# Patient Record
Sex: Female | Born: 1950 | Race: White | Hispanic: No | Marital: Married | Smoking: Former smoker
Health system: Southern US, Community
[De-identification: ages and names within clinical notes are randomized; demographics above are authoritative.]

## PROBLEM LIST (undated history)

## (undated) DIAGNOSIS — M1990 Unspecified osteoarthritis, unspecified site: Secondary | ICD-10-CM

## (undated) DIAGNOSIS — C801 Malignant (primary) neoplasm, unspecified: Secondary | ICD-10-CM

## (undated) DIAGNOSIS — M797 Fibromyalgia: Secondary | ICD-10-CM

## (undated) DIAGNOSIS — N979 Female infertility, unspecified: Secondary | ICD-10-CM

## (undated) DIAGNOSIS — R32 Unspecified urinary incontinence: Secondary | ICD-10-CM

## (undated) HISTORY — PX: TONSILLECTOMY: SUR1361

## (undated) HISTORY — DX: Unspecified osteoarthritis, unspecified site: M19.90

## (undated) HISTORY — DX: Female infertility, unspecified: N97.9

## (undated) HISTORY — DX: Fibromyalgia: M79.7

## (undated) HISTORY — DX: Malignant (primary) neoplasm, unspecified: C80.1

## (undated) HISTORY — DX: Unspecified urinary incontinence: R32

## (undated) HISTORY — PX: ANKLE SURGERY: SHX546

## (undated) HISTORY — PX: CHOLECYSTECTOMY: SHX55

---

## 1983-08-07 HISTORY — PX: PELVIC LAPAROSCOPY: SHX162

## 1984-08-06 HISTORY — PX: LAPAROTOMY: SHX154

## 1985-08-06 HISTORY — PX: OTHER SURGICAL HISTORY: SHX169

## 1989-08-06 DIAGNOSIS — M797 Fibromyalgia: Secondary | ICD-10-CM

## 1989-08-06 HISTORY — DX: Fibromyalgia: M79.7

## 1997-11-10 ENCOUNTER — Other Ambulatory Visit: Admission: RE | Admit: 1997-11-10 | Discharge: 1997-11-10 | Payer: Self-pay | Admitting: Obstetrics and Gynecology

## 1998-10-03 ENCOUNTER — Ambulatory Visit (HOSPITAL_COMMUNITY): Admission: RE | Admit: 1998-10-03 | Discharge: 1998-10-03 | Payer: Self-pay | Admitting: Gastroenterology

## 1998-12-26 ENCOUNTER — Other Ambulatory Visit: Admission: RE | Admit: 1998-12-26 | Discharge: 1998-12-26 | Payer: Self-pay | Admitting: Obstetrics and Gynecology

## 2000-04-02 ENCOUNTER — Other Ambulatory Visit: Admission: RE | Admit: 2000-04-02 | Discharge: 2000-04-02 | Payer: Self-pay | Admitting: Obstetrics and Gynecology

## 2000-04-17 ENCOUNTER — Encounter: Admission: RE | Admit: 2000-04-17 | Discharge: 2000-07-16 | Payer: Self-pay | Admitting: Gynecology

## 2000-12-30 ENCOUNTER — Encounter: Payer: Self-pay | Admitting: Obstetrics and Gynecology

## 2000-12-30 ENCOUNTER — Encounter: Admission: RE | Admit: 2000-12-30 | Discharge: 2000-12-30 | Payer: Self-pay | Admitting: Obstetrics and Gynecology

## 2001-04-23 ENCOUNTER — Other Ambulatory Visit: Admission: RE | Admit: 2001-04-23 | Discharge: 2001-04-23 | Payer: Self-pay | Admitting: Obstetrics and Gynecology

## 2002-01-29 ENCOUNTER — Encounter: Payer: Self-pay | Admitting: Obstetrics and Gynecology

## 2002-01-29 ENCOUNTER — Encounter: Admission: RE | Admit: 2002-01-29 | Discharge: 2002-01-29 | Payer: Self-pay | Admitting: Obstetrics and Gynecology

## 2002-09-29 ENCOUNTER — Other Ambulatory Visit: Admission: RE | Admit: 2002-09-29 | Discharge: 2002-09-29 | Payer: Self-pay | Admitting: Obstetrics and Gynecology

## 2003-03-24 ENCOUNTER — Encounter: Admission: RE | Admit: 2003-03-24 | Discharge: 2003-03-24 | Payer: Self-pay | Admitting: Obstetrics and Gynecology

## 2003-03-24 ENCOUNTER — Encounter: Payer: Self-pay | Admitting: Obstetrics and Gynecology

## 2003-12-28 ENCOUNTER — Ambulatory Visit (HOSPITAL_COMMUNITY): Admission: RE | Admit: 2003-12-28 | Discharge: 2003-12-28 | Payer: Self-pay | Admitting: Gastroenterology

## 2003-12-28 ENCOUNTER — Encounter (INDEPENDENT_AMBULATORY_CARE_PROVIDER_SITE_OTHER): Payer: Self-pay | Admitting: Specialist

## 2004-04-04 ENCOUNTER — Other Ambulatory Visit: Admission: RE | Admit: 2004-04-04 | Discharge: 2004-04-04 | Payer: Self-pay | Admitting: Obstetrics and Gynecology

## 2004-05-02 ENCOUNTER — Encounter: Admission: RE | Admit: 2004-05-02 | Discharge: 2004-05-02 | Payer: Self-pay | Admitting: Obstetrics and Gynecology

## 2005-07-09 ENCOUNTER — Encounter: Admission: RE | Admit: 2005-07-09 | Discharge: 2005-07-09 | Payer: Self-pay | Admitting: Obstetrics and Gynecology

## 2005-10-11 ENCOUNTER — Other Ambulatory Visit: Admission: RE | Admit: 2005-10-11 | Discharge: 2005-10-11 | Payer: Self-pay | Admitting: Obstetrics and Gynecology

## 2006-08-19 ENCOUNTER — Encounter: Admission: RE | Admit: 2006-08-19 | Discharge: 2006-08-19 | Payer: Self-pay | Admitting: Obstetrics and Gynecology

## 2006-11-20 ENCOUNTER — Other Ambulatory Visit: Admission: RE | Admit: 2006-11-20 | Discharge: 2006-11-20 | Payer: Self-pay | Admitting: Obstetrics and Gynecology

## 2007-11-11 ENCOUNTER — Encounter: Admission: RE | Admit: 2007-11-11 | Discharge: 2007-11-11 | Payer: Self-pay | Admitting: Obstetrics and Gynecology

## 2008-11-23 ENCOUNTER — Encounter: Admission: RE | Admit: 2008-11-23 | Discharge: 2008-11-23 | Payer: Self-pay | Admitting: Obstetrics and Gynecology

## 2009-01-18 ENCOUNTER — Other Ambulatory Visit: Admission: RE | Admit: 2009-01-18 | Discharge: 2009-01-18 | Payer: Self-pay | Admitting: Obstetrics and Gynecology

## 2009-01-18 ENCOUNTER — Encounter: Payer: Self-pay | Admitting: Obstetrics and Gynecology

## 2009-01-18 ENCOUNTER — Ambulatory Visit: Payer: Self-pay | Admitting: Obstetrics and Gynecology

## 2009-01-31 ENCOUNTER — Ambulatory Visit: Payer: Self-pay | Admitting: Obstetrics and Gynecology

## 2009-06-03 ENCOUNTER — Encounter: Admission: RE | Admit: 2009-06-03 | Discharge: 2009-06-03 | Payer: Self-pay | Admitting: Otolaryngology

## 2009-08-06 HISTORY — PX: OTHER SURGICAL HISTORY: SHX169

## 2009-08-06 HISTORY — PX: MOUTH SURGERY: SHX715

## 2009-08-23 ENCOUNTER — Ambulatory Visit (HOSPITAL_BASED_OUTPATIENT_CLINIC_OR_DEPARTMENT_OTHER): Admission: RE | Admit: 2009-08-23 | Discharge: 2009-08-23 | Payer: Self-pay | Admitting: Otolaryngology

## 2010-01-26 ENCOUNTER — Other Ambulatory Visit: Admission: RE | Admit: 2010-01-26 | Discharge: 2010-01-26 | Payer: Self-pay | Admitting: Obstetrics and Gynecology

## 2010-01-26 ENCOUNTER — Ambulatory Visit: Payer: Self-pay | Admitting: Obstetrics and Gynecology

## 2010-01-30 ENCOUNTER — Encounter: Admission: RE | Admit: 2010-01-30 | Discharge: 2010-01-30 | Payer: Self-pay | Admitting: Obstetrics and Gynecology

## 2010-10-22 LAB — POCT HEMOGLOBIN-HEMACUE: Hemoglobin: 15.4 g/dL — ABNORMAL HIGH (ref 12.0–15.0)

## 2010-12-22 NOTE — Op Note (Signed)
NAME:  Mary Marquez, Mary Marquez                      ACCOUNT NO.:  [id number]   MEDICAL RECORD NO.:  [id number]                   PATIENT TYPE:  AMB   LOCATION:  ENDO                                 FACILITY:  [facility]   PHYSICIAN:  Petra Kuba, M.D.                 DATE OF BIRTH:  September 11, 1950   DATE OF PROCEDURE:  12/28/2003  DATE OF DISCHARGE:                                 OPERATIVE REPORT   PROCEDURE:  Colonoscopy.   INDICATIONS FOR PROCEDURE:  A patient with a history of colon polyps due for  repeat screening.   Consent was signed after risks, benefits, methods, and options were  thoroughly discussed in the office multiple times in the past.   MEDICINES USED:  Demerol 70, Versed 7.   DESCRIPTION OF PROCEDURE:  Rectal inspection was pertinent for external  hemorrhoids, small.  She also had a small fissure. Digital exam was  negative. The video pediatric adjustable colonoscope was inserted, fairly  easily advanced around the colon to the cecum. This did require left lower  quadrant pressure but no position changes. No obvious abnormality was seen  on insertion. The cecum was identified by the appendiceal orifice and the  ileocecal valve.  The scope was slowly withdrawn. The prep was adequate.  There was some liquid stool that required washing and suctioning. On slow  withdrawal through the colon in the mid ascending a small polyp along the  fold was seen, snared, electrocautery applied and the bulb was suctioned  through the scope and collected in the trap. The scope was further  withdrawn. The transverse was normal. In the more proximal descending, a  tiny polyp was seen and was hot biopsied x1 and put in a separate container.  The scope was further withdrawn. No additional findings were seen as we  withdrew back to the rectum. Anal rectal pullthrough and retroflexion  confirmed some small hemorrhoids. The scope was reinserted a short ways up  the left side of the colon, air was  suctioned, scope removed. The patient  tolerated the procedure well. There was no obvious or immediate  complications.   ENDOSCOPIC DIAGNOSIS:  1. Internal and external hemorrhoids with a small fissure.  2. One tiny descending polyp hot biopsied.  3. Small ascending polyp snared.  4. Otherwise within normal limits to the cecum.   PLAN:  Await pathology to determine future colonic screening.  Happy to see  back p.r.n. otherwise return care to Dr. Clarene Duke for the customary health  care maintenance to include yearly rectals and guaiacs.                                               Petra Kuba, M.D.    MEM/MEDQ  D:  12/28/2003  T:  12/28/2003  Job:  [id number]   cc:   Caryn Bee L. Little, M.D.  [address]  [place]  [place] [address]  Fax: [phone]

## 2010-12-28 ENCOUNTER — Other Ambulatory Visit: Payer: Self-pay | Admitting: Obstetrics and Gynecology

## 2010-12-28 DIAGNOSIS — Z1231 Encounter for screening mammogram for malignant neoplasm of breast: Secondary | ICD-10-CM

## 2011-02-09 ENCOUNTER — Ambulatory Visit
Admission: RE | Admit: 2011-02-09 | Discharge: 2011-02-09 | Disposition: A | Discharge status: Home / Self Care | Payer: BC Managed Care – PPO | Source: Ambulatory Visit | Attending: Obstetrics and Gynecology | Admitting: Obstetrics and Gynecology

## 2011-02-09 DIAGNOSIS — Z1231 Encounter for screening mammogram for malignant neoplasm of breast: Secondary | ICD-10-CM

## 2011-04-24 DIAGNOSIS — N809 Endometriosis, unspecified: Secondary | ICD-10-CM | POA: Insufficient documentation

## 2011-05-03 ENCOUNTER — Other Ambulatory Visit: Payer: Self-pay | Admitting: Obstetrics and Gynecology

## 2011-05-03 ENCOUNTER — Ambulatory Visit: Admission: RE | Admit: 2011-05-03 | Payer: BC Managed Care – PPO | Source: Ambulatory Visit

## 2011-05-03 DIAGNOSIS — Z13820 Encounter for screening for osteoporosis: Secondary | ICD-10-CM

## 2011-05-03 DIAGNOSIS — M899 Disorder of bone, unspecified: Secondary | ICD-10-CM

## 2011-05-03 DIAGNOSIS — M949 Disorder of cartilage, unspecified: Secondary | ICD-10-CM

## 2011-05-30 ENCOUNTER — Encounter: Payer: Self-pay | Admitting: Obstetrics and Gynecology

## 2011-05-30 ENCOUNTER — Ambulatory Visit (INDEPENDENT_AMBULATORY_CARE_PROVIDER_SITE_OTHER): Payer: BC Managed Care – PPO | Admitting: Obstetrics and Gynecology

## 2011-05-30 ENCOUNTER — Other Ambulatory Visit (HOSPITAL_COMMUNITY)
Admission: RE | Admit: 2011-05-30 | Discharge: 2011-05-30 | Disposition: A | Discharge status: Home / Self Care | Payer: BC Managed Care – PPO | Source: Ambulatory Visit | Attending: Obstetrics and Gynecology | Admitting: Obstetrics and Gynecology

## 2011-05-30 VITALS — BP 116/74 | Ht 62.0 in

## 2011-05-30 DIAGNOSIS — M8580 Other specified disorders of bone density and structure, unspecified site: Secondary | ICD-10-CM

## 2011-05-30 DIAGNOSIS — M899 Disorder of bone, unspecified: Secondary | ICD-10-CM

## 2011-05-30 DIAGNOSIS — M1990 Unspecified osteoarthritis, unspecified site: Secondary | ICD-10-CM | POA: Insufficient documentation

## 2011-05-30 DIAGNOSIS — Z01419 Encounter for gynecological examination (general) (routine) without abnormal findings: Secondary | ICD-10-CM

## 2011-05-30 NOTE — Progress Notes (Signed)
Patient came to see me today for her annual GYN exam. She is having minimal menopausal symptoms. She is okay without estrogen. She is up-to-date on mammograms. She does her lab work to her PCP. She has been treated by him for arthritis. She is dealing with two parents with dementia which is very stressful. She is having no vaginal bleeding or pelvic pain. She does have a bone density in our office. She is osteopenia without an elevated FRAX risk. Her bone density was stable from the last one.  ROS: 12 point review done. History of basal cell skin cancer watched by Dr. Nicholas Lose. Arthritis. Otherwise all negative.  Physical examination: Kennon Portela present HEENT within normal limits. Neck: Thyroid not large. No masses. Supraclavicular nodes: not enlarged. Breasts: Examined in both sitting midline position. No skin changes and no masses. Abdomen: Soft no guarding rebound or masses or hernia. Pelvic: External: Within normal limits. BUS: Within normal limits. Vaginal:within normal limits. Good estrogen effect. No evidence of cystocele rectocele or enterocele. Cervix: clean. Uterus: Normal size and shape. Adnexa: No masses. Rectovaginal exam: Confirmatory and negative. Extremities: Within normal limits.  Assessment: Osteopenia  Plan: Continue yearly mammograms. Bone density in 2014.

## 2011-12-06 ENCOUNTER — Other Ambulatory Visit: Payer: Self-pay | Admitting: Family Medicine

## 2011-12-06 DIAGNOSIS — J329 Chronic sinusitis, unspecified: Secondary | ICD-10-CM

## 2011-12-13 ENCOUNTER — Ambulatory Visit
Admission: RE | Admit: 2011-12-13 | Discharge: 2011-12-13 | Disposition: A | Discharge status: Home / Self Care | Payer: BC Managed Care – PPO | Source: Ambulatory Visit | Attending: Family Medicine | Admitting: Family Medicine

## 2011-12-13 DIAGNOSIS — J329 Chronic sinusitis, unspecified: Secondary | ICD-10-CM

## 2013-08-12 ENCOUNTER — Encounter: Payer: Self-pay | Admitting: Obstetrics and Gynecology

## 2013-08-12 ENCOUNTER — Ambulatory Visit (INDEPENDENT_AMBULATORY_CARE_PROVIDER_SITE_OTHER): Payer: BC Managed Care – PPO | Admitting: Obstetrics and Gynecology

## 2013-08-12 VITALS — BP 127/76 | HR 74 | Resp 16 | Ht 62.5 in | Wt 195.0 lb

## 2013-08-12 DIAGNOSIS — Z0000 Encounter for general adult medical examination without abnormal findings: Secondary | ICD-10-CM

## 2013-08-12 DIAGNOSIS — M899 Disorder of bone, unspecified: Secondary | ICD-10-CM

## 2013-08-12 DIAGNOSIS — M8580 Other specified disorders of bone density and structure, unspecified site: Secondary | ICD-10-CM

## 2013-08-12 DIAGNOSIS — Z01419 Encounter for gynecological examination (general) (routine) without abnormal findings: Secondary | ICD-10-CM

## 2013-08-12 DIAGNOSIS — M949 Disorder of cartilage, unspecified: Secondary | ICD-10-CM

## 2013-08-12 LAB — POCT URINALYSIS DIPSTICK
BILIRUBIN UA: NEGATIVE
Glucose, UA: NEGATIVE
Ketones, UA: NEGATIVE
LEUKOCYTES UA: NEGATIVE
Nitrite, UA: NEGATIVE
PH UA: 5
Protein, UA: NEGATIVE
RBC UA: NEGATIVE
Urobilinogen, UA: NEGATIVE

## 2013-08-12 LAB — HEMOGLOBIN, FINGERSTICK: Hemoglobin, fingerstick: 14.2 g/dL (ref 12.0–16.0)

## 2013-08-12 NOTE — Progress Notes (Signed)
GYNECOLOGY VISIT  PCP: Dr. Hulan Fess   Referring provider:   HPI: 63 y.o.   Married  Caucasian  female   G2P2002 with Patient's last menstrual period was 08/06/1998.   here for   Annual Exam Patient had underarm bilateral pain when was using an underwire bra. Changed the bra and now feeling a lot better. Has gained 40 pounds due to stress of her ailing parents.  Both are now deceased - mother on Easter, father on July 4th.   Some incontinence with coughing.  Seems better now.   Not sexually active due to husband's health issues.   Would like to do a fasting cholesterol and blood work in 2 - 3 months after does weight loss through diet and exercise.   Magnesium helps with muscle cramping.    Hgb:  14.2 Urine:  neg  GYNECOLOGIC HISTORY: Patient's last menstrual period was 08/06/1998. Sexually active:  no Partner preference: female Contraception:  Menopausal  Menopausal hormone therapy: no DES exposure:   no Blood transfusions:   no Sexually transmitted diseases:   no GYN Procedures:  Fallopian  tube removed - RSO - endometriosis Mammogram:  2011    normal           Pap:   2011 neg History of abnormal pap smear:  ?   OB History   Grav Para Term Preterm Abortions TAB SAB Ect Mult Living   2 2 2       2        LIFESTYLE: Exercise:  Not now             Tobacco: quit 40+ years ago Alcohol: 2-3 glasses of wine a week Drug use:  no  OTHER HEALTH MAINTENANCE: Tetanus/TDap: less than 10years Gardisil: no Influenza: yes  Zostavax:yes  Bone density: 2012 - osteopenia.  No T score recorded from [facility] Colonoscopy: 2010 polyps, repeat in 5 years  Cholesterol check: 2011  Family History  Problem Relation Age of Onset  . Diabetes Father   . Alzheimer's disease Father   . Cancer Father     Bone/Bladder Cancer  . Prostate cancer Father   . Cancer Paternal Grandmother     OVARIAN? UTERINE?  Marland Kitchen Cancer Cousin     PATERNAL/COLON CANCER  . Stroke Maternal  Grandmother   . Alzheimer's disease Mother   . Cancer Mother     colon cancer    Patient Active Problem List   Diagnosis Date Noted  . Arthritis   . Endometriosis    Past Medical History  Diagnosis Date  . Endometriosis   . Arthritis   . Cancer     Basal cell  . Fibromyalgia 1991  . Infertility, female   . Urinary incontinence     Past Surgical History  Procedure Laterality Date  . Tonsillectomy    . Exploratory laparotomy, rso and exc of endometriosis  1986  . Pelvic laparoscopy  1985    DIAG LAP W/TUBAL LAVAGE  . Cholecystectomy    . Mouth surgery  2011  . Oophorectomy  1986    RSO AT TIME OF LAPAROTOMY  . Ankle surgery    . Removal fallopian tube  1987  . Polyp removed Right 2011    Near side side of Nose    ALLERGIES: Other  Current Outpatient Prescriptions  Medication Sig Dispense Refill  . aspirin 81 MG tablet Take 81 mg by mouth daily.        Marland Kitchen BIOTIN PO Take by mouth.        Marland Kitchen  cholecalciferol (VITAMIN D) 1000 UNITS tablet Take 1,000 Units by mouth daily.        Marland Kitchen CINNAMON PO Take by mouth daily.      . Coenzyme Q10 (CO Q 10 PO) Take by mouth.        . CRANBERRY PO Take by mouth daily.      Marland Kitchen EPINEPHrine (EPI-PEN) 0.3 mg/0.3 mL DEVI Inject 0.3 mg into the muscle once.        Marland Kitchen FIBER SELECT GUMMIES PO Take by mouth daily.      Marland Kitchen MAGNESIUM CITRATE PO Take 250 mg by mouth.      . Multiple Vitamin (MULTIVITAMIN) capsule Take 1 capsule by mouth daily.        . Omega-3 Fatty Acids (FISH OIL PO) Take by mouth.        . Probiotic Product (PROBIOTIC FORMULA PO) Take by mouth.        . vitamin C (ASCORBIC ACID) 250 MG tablet Take 250 mg by mouth daily.      . Flaxseed, Linseed, (FLAX SEEDS PO) Take by mouth.         No current facility-administered medications for this visit.     ROS:  Pertinent items are noted in HPI.  SOCIAL HISTORY:  Retired  PHYSICAL EXAMINATION:    BP 127/76  Pulse 74  Resp 16  Ht 5' 2.5" (1.588 m)  Wt 195 lb (88.451 kg)  BMI  35.08 kg/m2  LMP 08/06/1998   Wt Readings from Last 3 Encounters:  08/12/13 195 lb (88.451 kg)     Ht Readings from Last 3 Encounters:  08/12/13 5' 2.5" (1.588 m)  05/30/11 5\' 2"  (1.575 m)    General appearance: alert, cooperative and appears stated age Head: Normocephalic, without obvious abnormality, atraumatic Neck: no adenopathy, supple, symmetrical, trachea midline and thyroid not enlarged, symmetric, no tenderness/mass/nodules Lungs: clear to auscultation bilaterally Breasts: Inspection negative, No nipple retraction or dimpling, No nipple discharge or bleeding, No axillary or supraclavicular adenopathy, Normal to palpation without dominant masses Heart: regular rate and rhythm Abdomen: soft, non-tender; no masses,  no organomegaly Extremities: extremities normal, atraumatic, no cyanosis or edema Skin: Skin color, texture, turgor normal. No rashes or lesions Lymph nodes: Cervical, supraclavicular, and axillary nodes normal. No abnormal inguinal nodes palpated Neurologic: Grossly normal  Pelvic: External genitalia:  no lesions              Urethra:  normal appearing urethra with no masses, tenderness or lesions              Bartholins and Skenes: normal                 Vagina: normal appearing vagina with normal color and discharge, no lesions              Cervix: normal appearance              Pap and high risk HPV testing done: yes.            Bimanual Exam:  Uterus:  uterus is normal size, shape, consistency and nontender                                      Adnexa: normal adnexa in size, nontender and no masses  Rectovaginal: Confirms                                      Anus:  normal sphincter tone, no lesions  ASSESSMENT  Normal gynecologic exam. Osteopenia.  PLAN  M[facility] at [facility].  Patient will call. Pap smear and high risk HPV testing performed. Bone density at [facility].   Patient will call. Counseled on   Calcium, vit D, weight bearing exercise, weight loss. Will return for labs in 3 months.  Will place an order for future lipid profile, CBC, CMP, TSH. Return annually or prn.   An After Visit Summary was printed and given to the patient.

## 2013-08-12 NOTE — Patient Instructions (Signed)

## 2013-08-14 ENCOUNTER — Other Ambulatory Visit: Payer: Self-pay | Admitting: Obstetrics and Gynecology

## 2013-08-14 DIAGNOSIS — Z1231 Encounter for screening mammogram for malignant neoplasm of breast: Secondary | ICD-10-CM

## 2013-08-17 LAB — IPS PAP TEST WITH HPV

## 2013-09-15 ENCOUNTER — Ambulatory Visit: Payer: BC Managed Care – PPO

## 2013-09-15 ENCOUNTER — Ambulatory Visit
Admission: RE | Admit: 2013-09-15 | Discharge: 2013-09-15 | Disposition: A | Discharge status: Home / Self Care | Payer: BC Managed Care – PPO | Source: Ambulatory Visit | Attending: Obstetrics and Gynecology | Admitting: Obstetrics and Gynecology

## 2013-09-15 DIAGNOSIS — M8580 Other specified disorders of bone density and structure, unspecified site: Secondary | ICD-10-CM

## 2013-09-15 DIAGNOSIS — Z1231 Encounter for screening mammogram for malignant neoplasm of breast: Secondary | ICD-10-CM

## 2014-06-07 ENCOUNTER — Encounter: Payer: Self-pay | Admitting: Obstetrics and Gynecology

## 2014-08-13 ENCOUNTER — Ambulatory Visit: Payer: BC Managed Care – PPO | Admitting: Obstetrics and Gynecology

## 2014-12-28 ENCOUNTER — Other Ambulatory Visit: Payer: Self-pay

## 2014-12-28 DIAGNOSIS — Z1231 Encounter for screening mammogram for malignant neoplasm of breast: Secondary | ICD-10-CM

## 2014-12-30 ENCOUNTER — Ambulatory Visit
Admission: RE | Admit: 2014-12-30 | Discharge: 2014-12-30 | Disposition: A | Discharge status: Home / Self Care | Payer: Medicare Other | Source: Ambulatory Visit

## 2014-12-30 DIAGNOSIS — Z1231 Encounter for screening mammogram for malignant neoplasm of breast: Secondary | ICD-10-CM

## 2015-01-14 ENCOUNTER — Telehealth: Payer: Self-pay | Admitting: Obstetrics and Gynecology

## 2015-01-14 NOTE — Telephone Encounter (Signed)
Patient is scheduled for her aex 03/10/15 and is requesting fasting labs at that appointment. Last seen 08/12/13.

## 2015-01-17 NOTE — Telephone Encounter (Signed)
Left message to call [name] at [phone]. 

## 2015-01-21 NOTE — Telephone Encounter (Signed)
Left message to call Baldo Hufnagle at [phone]. 

## 2015-01-26 NOTE — Telephone Encounter (Signed)
Spoke with patient. Patient has aex appointment scheduled for 03/10/2015 with Dr.Jertson. Patient would like to have fasting labs at this appointment. Advised may have fasting labs with aex appointment. Advised will need to notify CMA and MD when she comes in for appointment to ensure all orders are placed. Patient is agreeable.  Routing to provider for final review. Patient agreeable to disposition. Will close encounter.

## 2015-03-10 ENCOUNTER — Telehealth: Payer: Self-pay | Admitting: Obstetrics and Gynecology

## 2015-03-10 ENCOUNTER — Ambulatory Visit: Payer: Self-pay | Admitting: Obstetrics and Gynecology

## 2015-03-10 NOTE — Telephone Encounter (Signed)
Patient cancelled appointment for today.  Separate staff message sent.

## 2015-03-15 ENCOUNTER — Other Ambulatory Visit: Payer: Self-pay | Admitting: Gastroenterology

## 2015-03-17 NOTE — Telephone Encounter (Signed)
Left message on voicemail to call and reschedule aex. [employer] auth# [id number] 8/4-10/4/16.

## 2015-03-17 NOTE — Telephone Encounter (Signed)
Patient returned call and scheduled aex.

## 2015-04-14 ENCOUNTER — Ambulatory Visit (INDEPENDENT_AMBULATORY_CARE_PROVIDER_SITE_OTHER): Payer: Medicare Other | Admitting: Obstetrics and Gynecology

## 2015-04-14 ENCOUNTER — Encounter: Payer: Self-pay | Admitting: Obstetrics and Gynecology

## 2015-04-14 VITALS — BP 124/80 | HR 78 | Resp 14 | Ht 62.0 in | Wt 180.0 lb

## 2015-04-14 DIAGNOSIS — Z0000 Encounter for general adult medical examination without abnormal findings: Secondary | ICD-10-CM

## 2015-04-14 DIAGNOSIS — Z01419 Encounter for gynecological examination (general) (routine) without abnormal findings: Secondary | ICD-10-CM | POA: Diagnosis not present

## 2015-04-14 DIAGNOSIS — R32 Unspecified urinary incontinence: Secondary | ICD-10-CM | POA: Insufficient documentation

## 2015-04-14 DIAGNOSIS — M797 Fibromyalgia: Secondary | ICD-10-CM | POA: Insufficient documentation

## 2015-04-14 LAB — COMPREHENSIVE METABOLIC PANEL
ALBUMIN: 4.5 g/dL (ref 3.6–5.1)
ALT: 27 U/L (ref 6–29)
AST: 27 U/L (ref 10–35)
Alkaline Phosphatase: 77 U/L (ref 33–130)
BUN: 13 mg/dL (ref 7–25)
CALCIUM: 9.3 mg/dL (ref 8.6–10.4)
CHLORIDE: 103 mmol/L (ref 98–110)
CO2: 29 mmol/L (ref 20–31)
Creat: 0.73 mg/dL (ref 0.50–0.99)
Glucose, Bld: 85 mg/dL (ref 65–99)
POTASSIUM: 4.8 mmol/L (ref 3.5–5.3)
Sodium: 140 mmol/L (ref 135–146)
TOTAL PROTEIN: 7.2 g/dL (ref 6.1–8.1)
Total Bilirubin: 0.7 mg/dL (ref 0.2–1.2)

## 2015-04-14 LAB — CBC
HEMATOCRIT: 43.6 % (ref 36.0–46.0)
HEMOGLOBIN: 14.6 g/dL (ref 12.0–15.0)
MCH: 29.4 pg (ref 26.0–34.0)
MCHC: 33.5 g/dL (ref 30.0–36.0)
MCV: 87.9 fL (ref 78.0–100.0)
MPV: 8.8 fL (ref 8.6–12.4)
Platelets: 283 10*3/uL (ref 150–400)
RBC: 4.96 MIL/uL (ref 3.87–5.11)
RDW: 13.5 % (ref 11.5–15.5)
WBC: 6.3 10*3/uL (ref 4.0–10.5)

## 2015-04-14 LAB — LIPID PANEL
CHOLESTEROL: 235 mg/dL — AB (ref 125–200)
HDL: 63 mg/dL (ref >=46)
LDL CALC: 129 mg/dL (ref <130)
Total CHOL/HDL Ratio: 3.7 Ratio (ref <=5.0)
Triglycerides: 216 mg/dL — ABNORMAL HIGH (ref <150)
VLDL: 43 mg/dL — ABNORMAL HIGH (ref <30)

## 2015-04-14 LAB — POCT URINALYSIS DIPSTICK
Bilirubin, UA: NEGATIVE
Blood, UA: NEGATIVE
GLUCOSE UA: NEGATIVE
Ketones, UA: NEGATIVE
Leukocytes, UA: NEGATIVE
Nitrite, UA: NEGATIVE
Protein, UA: NEGATIVE
UROBILINOGEN UA: NEGATIVE
pH, UA: 6.5

## 2015-04-14 NOTE — Progress Notes (Addendum)
Patient ID: Mary Marquez, female   DOB: 1951/03/15, 64 y.o.   MRN: [id number] 64 y.o. [id number] MarriedCaucasianF here for annual exam.  Not bleeding. Not sexually active secondary to husbands medical issues. She has had weight gain along with emotional stress of her parents death. Has always gone up and down. She has lost some weight.     Patient's last menstrual period was 08/06/1998.          Sexually active: No.  The current method of family planning is post menopausal status.    Exercising: Yes.    yard work Smoker:  no  Health Maintenance: Pap: 08-12-13 WNL NEG HPV  History of abnormal Pap:  No sure, no h/o dysplasia. MMG:  12-30-14 WNL  Colonoscopy:  03/2015 1 polyp - repeat in 5 yrs  BMD:   09-15-13 Osteopenia of hip  TDaP:  Unsure, she will check with her primary Gardasil: N/A   reports that she quit smoking about 41 years ago. She has never used smokeless tobacco. She reports that she drinks about 1.5 oz of alcohol per week. She reports that she does not use illicit drugs.  Past Medical History  Diagnosis Date  . Endometriosis   . Arthritis   . Cancer     Basal cell  . Fibromyalgia 1991  . Infertility, female   . Urinary incontinence     Past Surgical History  Procedure Laterality Date  . Tonsillectomy    . Laparotomy  1986    salpingectomy  . Pelvic laparoscopy  1985    DIAG LAP W/TUBAL LAVAGE  . Cholecystectomy    . Mouth surgery  2011  . Ankle surgery    . Removal fallopian tube  1987  . Polyp removed Right 2011    Near side side of Nose  She states both tubes were removed, has her ovaries  Current Outpatient Prescriptions  Medication Sig Dispense Refill  . BIOTIN PO Take by mouth.      . calcium citrate (CALCITRATE - DOSED IN MG ELEMENTAL CALCIUM) 950 MG tablet Take 200 mg of elemental calcium by mouth daily.    . cholecalciferol (VITAMIN D) 1000 UNITS tablet Take 1,000 Units by mouth daily.      . Coenzyme Q10 (CO Q 10 PO) Take by mouth.      . FIBER  SELECT GUMMIES PO Take by mouth daily.    . Flaxseed, Linseed, (FLAX SEEDS PO) Take by mouth.      Marland Kitchen MAGNESIUM CITRATE PO Take 250 mg by mouth.    . Multiple Vitamin (MULTIVITAMIN) capsule Take 1 capsule by mouth daily.      . Omega-3 Fatty Acids (FISH OIL PO) Take by mouth.      . Probiotic Product (PROBIOTIC FORMULA PO) Take by mouth.      . vitamin C (ASCORBIC ACID) 250 MG tablet Take 250 mg by mouth daily.    Marland Kitchen EPINEPHrine (EPI-PEN) 0.3 mg/0.3 mL DEVI Inject 0.3 mg into the muscle once.       No current facility-administered medications for this visit.    Family History  Problem Relation Age of Onset  . Diabetes Father   . Alzheimer's disease Father   . Cancer Father     Bone/Bladder Cancer  . Prostate cancer Father   . Cancer Paternal Grandmother     OVARIAN? UTERINE?  Marland Kitchen Cancer Cousin     PATERNAL/COLON CANCER  . Stroke Maternal Grandmother   . Alzheimer's disease Mother   .  Cancer Mother     colon cancer  Parents died at 78 (mom) and 69 (dad)  Review of Systems  Constitutional: Negative.   HENT: Positive for rhinorrhea and sinus pressure.   Eyes: Negative.   Respiratory: Negative.   Cardiovascular: Negative.   Gastrointestinal: Negative.   Endocrine: Negative.   Genitourinary: Negative.   Musculoskeletal: Negative.   Skin: Negative.   Allergic/Immunologic: Negative.   Neurological: Negative.   Psychiatric/Behavioral: Negative.     Exam:   BP 124/80 mmHg  Pulse 78  Resp 14  Ht 5\' 2"  (1.575 m)  Wt 180 lb (81.647 kg)  BMI 32.91 kg/m2  LMP 08/06/1998  Weight change: @WEIGHTCHANGE @ Height:   Height: 5\' 2"  (157.5 cm)  Ht Readings from Last 3 Encounters:  04/14/15 5\' 2"  (1.575 m)  08/12/13 5' 2.5" (1.588 m)  05/30/11 5\' 2"  (1.575 m)    General appearance: alert, cooperative and appears stated age Head: Normocephalic, without obvious abnormality, atraumatic Neck: no adenopathy, supple, symmetrical, trachea midline and thyroid normal to inspection and  palpation Lungs: clear to auscultation bilaterally Breasts: normal appearance, no masses or tenderness Heart: regular rate and rhythm Abdomen: soft, non-tender; bowel sounds normal; no masses,  no organomegaly Extremities: extremities normal, atraumatic, no cyanosis or edema Skin: Skin color, texture, turgor normal. No rashes or lesions Lymph nodes: Cervical, supraclavicular, and axillary nodes normal. No abnormal inguinal nodes palpated Neurologic: Grossly normal   Pelvic: External genitalia:  no lesions              Urethra:  normal appearing urethra with no masses, tenderness or lesions              Bartholins and Skenes: normal                 Vagina: normal appearing vagina with normal color and discharge, no lesions              Cervix: no lesions               Bimanual Exam:  Uterus:  normal size, contour, position, consistency, mobility, non-tender and anteverted              Adnexa: no mass, fullness, tenderness               Rectovaginal: Confirms               Anus:  normal sphincter tone, no lesions  Chaperone was present for exam.  A:  Well Woman with normal exam  Very mild osteopenia, T score of -1.1  P:   No pap this year  Screening labs  Next DEXA in 4 years  Continue calcium, vit D, exercise  Discussed BSE  Yearly mammogram

## 2015-04-14 NOTE — Patient Instructions (Signed)

## 2015-04-15 LAB — VITAMIN D 25 HYDROXY (VIT D DEFICIENCY, FRACTURES): VIT D 25 HYDROXY: 46 ng/mL (ref 30–100)

## 2015-04-18 ENCOUNTER — Telehealth: Payer: Self-pay | Admitting: Obstetrics and Gynecology

## 2015-04-18 NOTE — Telephone Encounter (Signed)
Result note just sent to [facility]

## 2015-04-18 NOTE — Telephone Encounter (Signed)
Routing to [place] for review and advise of results from 04/14/2015.

## 2015-04-18 NOTE — Telephone Encounter (Signed)
Patient is calling for recent lab results, last seen 04/14/15.

## 2015-04-18 NOTE — Telephone Encounter (Signed)
Spoke with patient. Results given as seen below. Patient is agreeable and verbalizes understanding. Patient has an appointment scheduled to see her PCP coming up. Will follow up regarding her cholesterol levels.  Routing to provider for final review. Patient agreeable to disposition. Will close encounter.

## 2015-04-18 NOTE — Telephone Encounter (Signed)
Left message to call [place] at [phone].  Notes Recorded by Salvadore Dom, MD on 04/18/2015 at 3:14 PM Please inform the patient that her total cholesterol and triglycerides were elevated. If she wasn't fasting, she should repeat fasting, other wise she should be on a diet low in saturated fats, exercise regularly and f/u with her primary. The rest of her labs were normal.  Thanks

## 2015-12-20 ENCOUNTER — Other Ambulatory Visit: Payer: Self-pay

## 2015-12-20 DIAGNOSIS — Z1231 Encounter for screening mammogram for malignant neoplasm of breast: Secondary | ICD-10-CM

## 2016-01-04 ENCOUNTER — Ambulatory Visit
Admission: RE | Admit: 2016-01-04 | Discharge: 2016-01-04 | Disposition: A | Discharge status: Home / Self Care | Payer: Medicare Other | Source: Ambulatory Visit

## 2016-01-04 DIAGNOSIS — Z1231 Encounter for screening mammogram for malignant neoplasm of breast: Secondary | ICD-10-CM

## 2016-10-03 ENCOUNTER — Ambulatory Visit (INDEPENDENT_AMBULATORY_CARE_PROVIDER_SITE_OTHER): Payer: Medicare Other | Admitting: Obstetrics and Gynecology

## 2016-10-03 ENCOUNTER — Encounter: Payer: Self-pay | Admitting: Obstetrics and Gynecology

## 2016-10-03 ENCOUNTER — Ambulatory Visit: Payer: Medicare Other | Admitting: Obstetrics and Gynecology

## 2016-10-03 VITALS — BP 110/60 | HR 72 | Resp 16 | Ht 62.0 in

## 2016-10-03 DIAGNOSIS — Z01419 Encounter for gynecological examination (general) (routine) without abnormal findings: Secondary | ICD-10-CM

## 2016-10-03 DIAGNOSIS — M8580 Other specified disorders of bone density and structure, unspecified site: Secondary | ICD-10-CM

## 2016-10-03 DIAGNOSIS — Z124 Encounter for screening for malignant neoplasm of cervix: Secondary | ICD-10-CM

## 2016-10-03 NOTE — Patient Instructions (Signed)

## 2016-10-03 NOTE — Addendum Note (Signed)
Addended by: Dorothy Spark on: 10/03/2016 12:48 PM   Modules accepted: Orders

## 2016-10-03 NOTE — Progress Notes (Signed)
66 y.o. [id number] MarriedCaucasianF here for annual exam.   She c/o pain in her rib cage around to her shoulder. The tissue in her lateral chest wall gets tender and hot. She has stopped wearing a bra with support because it hurts the tissue. Her breast are very large and she is wondering if that is the problem. If she takes tylenol it helps. She has a h/o fibromyalgia. She is going to try and loose weight. She has lost 8 lbs since January, some from GERD, some with stress.  She c/o fatigue. She will have her thyroid checked with her primary.  No vaginal bleeding. She has occasional GSI. It is better than it was.  Not sexually active (husbands medical issues).    Patient's last menstrual period was 08/06/1998.          Sexually active: No.  The current method of family planning is post menopausal status.    Exercising: No.  The patient does not participate in regular exercise at present. Smoker:  Former smoker   Health Maintenance: Pap:  08-12-13 WNL NEG HR HPV History of abnormal Pap:  yes years ago MMG:  01-04-16 WNL  Colonoscopy:  2015 Polyps, due in 2020 BMD:   09-15-13 Osteopenia of hip  TDaP:  Up to date PCP does  Gardasil: N/A   reports that she quit smoking about 43 years ago. She has never used smokeless tobacco. She reports that she does not drink alcohol or use drugs. Her son and daughter-in-law are fostering to adopt twins, one of them had a fractured leg, issues with DES. She and her husband are helping out, lots of stress.  Daughter is married, had a baby boy in August, has a 27 and 103 year old and fosters twin boys.   Past Medical History:  Diagnosis Date  . Arthritis   . Cancer (HCC)    Basal cell  . Endometriosis   . Fibromyalgia 1991  . Infertility, female   . Urinary incontinence     Past Surgical History:  Procedure Laterality Date  . ANKLE SURGERY    . CHOLECYSTECTOMY    . LAPAROTOMY  1986   salpingectomy  . MOUTH SURGERY  2011  . PELVIC LAPAROSCOPY  1985    DIAG LAP W/TUBAL LAVAGE  . POLYP REMOVED Right 2011   Near side side of Nose  . removal fallopian tube  1987  . TONSILLECTOMY      Current Outpatient Prescriptions  Medication Sig Dispense Refill  . cholecalciferol (VITAMIN D) 1000 UNITS tablet Take 1,000 Units by mouth daily.      Marland Kitchen EPINEPHrine (EPI-PEN) 0.3 mg/0.3 mL DEVI Inject 0.3 mg into the muscle once.      . Multiple Vitamin (MULTIVITAMIN) capsule Take 1 capsule by mouth daily.      Marland Kitchen BIOTIN PO Take by mouth.      . calcium citrate (CALCITRATE - DOSED IN MG ELEMENTAL CALCIUM) 950 MG tablet Take 200 mg of elemental calcium by mouth daily.    . Coenzyme Q10 (CO Q 10 PO) Take by mouth.      . FIBER SELECT GUMMIES PO Take by mouth daily.    . Flaxseed, Linseed, (FLAX SEEDS PO) Take by mouth.      Marland Kitchen MAGNESIUM CITRATE PO Take 250 mg by mouth.    . Omega-3 Fatty Acids (FISH OIL PO) Take by mouth.      . Probiotic Product (PROBIOTIC FORMULA PO) Take by mouth.      Marland Kitchen  vitamin C (ASCORBIC ACID) 250 MG tablet Take 250 mg by mouth daily.     No current facility-administered medications for this visit.     Family History  Problem Relation Age of Onset  . Diabetes Father   . Alzheimer's disease Father   . Cancer Father     Bone/Bladder Cancer  . Prostate cancer Father   . Cancer Paternal Grandmother     OVARIAN? UTERINE?  Marland Kitchen Cancer Cousin     PATERNAL/COLON CANCER  . Stroke Maternal Grandmother   . Alzheimer's disease Mother   . Cancer Mother     colon cancer    Review of Systems  Constitutional: Negative.   HENT: Negative.   Eyes: Negative.   Respiratory: Negative.   Cardiovascular: Negative.   Gastrointestinal: Negative.   Endocrine: Negative.   Genitourinary: Negative.   Musculoskeletal: Negative.        Upper back pain  Skin: Negative.   Allergic/Immunologic: Negative.   Neurological: Negative.   Psychiatric/Behavioral: Negative.     Exam:   BP 110/60 (BP Location: Right Arm, Patient Position: Sitting, Cuff  Size: Normal)   Pulse 72   Resp 16   Ht 5\' 2"  (1.575 m)   LMP 08/06/1998   Weight change: @WEIGHTCHANGE @ Height:   Height: 5\' 2"  (157.5 cm)  Ht Readings from Last 3 Encounters:  10/03/16 5\' 2"  (1.575 m)  04/14/15 5\' 2"  (1.575 m)  08/12/13 5' 2.5" (1.588 m)    General appearance: alert, cooperative and appears stated age Head: Normocephalic, without obvious abnormality, atraumatic Neck: no adenopathy, supple, symmetrical, trachea midline and thyroid normal to inspection and palpation Lungs: clear to auscultation bilaterally Cardiovascular: regular rate and rhythm Breasts: normal appearance, no masses or tenderness Heart: regular rate and rhythm Abdomen: soft, non-tender; bowel sounds normal; no masses,  no organomegaly Extremities: extremities normal, atraumatic, no cyanosis or edema Skin: Skin color, texture, turgor normal. No rashes or lesions Lymph nodes: Cervical, supraclavicular, and axillary nodes normal. No abnormal inguinal nodes palpated Neurologic: Grossly normal Back: upper back, lateral chest wall with point areas of tenderness   Pelvic: External genitalia:  no lesions              Urethra:  normal appearing urethra with no masses, tenderness or lesions              Bartholins and Skenes: normal                 Vagina: normal appearing vagina with normal color and discharge, no lesions              Cervix: no lesions               Bimanual Exam:  Uterus:  normal size, contour, position, consistency, mobility, non-tender              Adnexa: no mass, fullness, tenderness               Rectovaginal: Confirms               Anus:  normal sphincter tone, no lesions  Chaperone was present for exam.  A:  Well Woman with normal exam  Mild osteopenia  Upper back and lateral chest wall pain, could be her fibromyalgia, could be her large breast  P:   Pap with reflex hpv  Dexa in 2020  Labs with primary MD  Discussed breast self exam  Discussed calcium and vit D  intake  F/U in 1-2 years  Discussed having a properly fitting bra, she is working on weight loss, she will discuss possible Physical Therapy

## 2016-10-05 LAB — IPS PAP TEST WITH REFLEX TO HPV

## 2016-10-31 ENCOUNTER — Ambulatory Visit: Payer: Medicare Other | Admitting: Obstetrics and Gynecology

## 2016-12-11 ENCOUNTER — Other Ambulatory Visit: Payer: Self-pay | Admitting: Obstetrics and Gynecology

## 2016-12-11 DIAGNOSIS — Z1231 Encounter for screening mammogram for malignant neoplasm of breast: Secondary | ICD-10-CM

## 2017-01-23 ENCOUNTER — Ambulatory Visit
Admission: RE | Admit: 2017-01-23 | Discharge: 2017-01-23 | Disposition: A | Discharge status: Home / Self Care | Payer: Medicare Other | Source: Ambulatory Visit | Attending: Obstetrics and Gynecology | Admitting: Obstetrics and Gynecology

## 2017-01-23 DIAGNOSIS — Z1231 Encounter for screening mammogram for malignant neoplasm of breast: Secondary | ICD-10-CM

## 2017-08-08 DIAGNOSIS — L570 Actinic keratosis: Secondary | ICD-10-CM | POA: Diagnosis not present

## 2017-08-08 DIAGNOSIS — L720 Epidermal cyst: Secondary | ICD-10-CM | POA: Diagnosis not present

## 2017-08-08 DIAGNOSIS — Z85828 Personal history of other malignant neoplasm of skin: Secondary | ICD-10-CM | POA: Diagnosis not present

## 2017-08-13 DIAGNOSIS — Z1389 Encounter for screening for other disorder: Secondary | ICD-10-CM | POA: Diagnosis not present

## 2017-08-13 DIAGNOSIS — Z0000 Encounter for general adult medical examination without abnormal findings: Secondary | ICD-10-CM | POA: Diagnosis not present

## 2017-08-13 DIAGNOSIS — E7800 Pure hypercholesterolemia, unspecified: Secondary | ICD-10-CM | POA: Diagnosis not present

## 2017-08-15 ENCOUNTER — Other Ambulatory Visit: Payer: Self-pay | Admitting: Family Medicine

## 2017-08-15 DIAGNOSIS — M8580 Other specified disorders of bone density and structure, unspecified site: Secondary | ICD-10-CM

## 2017-08-26 ENCOUNTER — Ambulatory Visit
Admission: RE | Admit: 2017-08-26 | Discharge: 2017-08-26 | Disposition: A | Discharge status: Home / Self Care | Payer: Medicare HMO | Source: Ambulatory Visit | Attending: Family Medicine | Admitting: Family Medicine

## 2017-08-26 DIAGNOSIS — M8580 Other specified disorders of bone density and structure, unspecified site: Secondary | ICD-10-CM

## 2017-08-26 DIAGNOSIS — Z780 Asymptomatic menopausal state: Secondary | ICD-10-CM | POA: Diagnosis not present

## 2017-08-26 DIAGNOSIS — M85851 Other specified disorders of bone density and structure, right thigh: Secondary | ICD-10-CM | POA: Diagnosis not present

## 2017-10-02 DIAGNOSIS — H524 Presbyopia: Secondary | ICD-10-CM | POA: Diagnosis not present

## 2017-10-02 DIAGNOSIS — H35372 Puckering of macula, left eye: Secondary | ICD-10-CM | POA: Diagnosis not present

## 2017-10-02 DIAGNOSIS — H2513 Age-related nuclear cataract, bilateral: Secondary | ICD-10-CM | POA: Diagnosis not present

## 2017-10-02 DIAGNOSIS — H52203 Unspecified astigmatism, bilateral: Secondary | ICD-10-CM | POA: Diagnosis not present

## 2017-10-02 DIAGNOSIS — H5203 Hypermetropia, bilateral: Secondary | ICD-10-CM | POA: Diagnosis not present

## 2017-10-02 DIAGNOSIS — H1851 Endothelial corneal dystrophy: Secondary | ICD-10-CM | POA: Diagnosis not present

## 2017-11-26 DIAGNOSIS — Z85828 Personal history of other malignant neoplasm of skin: Secondary | ICD-10-CM | POA: Diagnosis not present

## 2017-11-26 DIAGNOSIS — D2272 Melanocytic nevi of left lower limb, including hip: Secondary | ICD-10-CM | POA: Diagnosis not present

## 2017-11-26 DIAGNOSIS — D225 Melanocytic nevi of trunk: Secondary | ICD-10-CM | POA: Diagnosis not present

## 2017-11-26 DIAGNOSIS — L570 Actinic keratosis: Secondary | ICD-10-CM | POA: Diagnosis not present

## 2017-11-26 DIAGNOSIS — D1801 Hemangioma of skin and subcutaneous tissue: Secondary | ICD-10-CM | POA: Diagnosis not present

## 2017-11-26 DIAGNOSIS — L723 Sebaceous cyst: Secondary | ICD-10-CM | POA: Diagnosis not present

## 2017-11-26 DIAGNOSIS — L820 Inflamed seborrheic keratosis: Secondary | ICD-10-CM | POA: Diagnosis not present

## 2017-11-26 DIAGNOSIS — D2271 Melanocytic nevi of right lower limb, including hip: Secondary | ICD-10-CM | POA: Diagnosis not present

## 2017-11-26 DIAGNOSIS — L720 Epidermal cyst: Secondary | ICD-10-CM | POA: Diagnosis not present

## 2019-09-04 ENCOUNTER — Ambulatory Visit: Payer: Medicare HMO

## 2019-09-12 ENCOUNTER — Ambulatory Visit: Payer: Medicare Other | Attending: Internal Medicine

## 2019-09-12 DIAGNOSIS — Z23 Encounter for immunization: Secondary | ICD-10-CM | POA: Insufficient documentation

## 2019-09-12 NOTE — Progress Notes (Signed)
   Covid-19 Vaccination Clinic  Name:  Mary Marquez    MRN: [id number] DOB: 1951-05-27  09/12/2019  Mary Marquez was observed post Covid-19 immunization for 15 minutes without incidence. She was provided with Vaccine Information Sheet and instruction to access the V-Safe system.   Mary Marquez was instructed to call 911 with any severe reactions post vaccine: Marland Kitchen Difficulty breathing  . Swelling of your face and throat  . A fast heartbeat  . A bad rash all over your body  . Dizziness and weakness    Immunizations Administered    Name Date Dose VIS Date Route   Pfizer COVID-19 Vaccine 09/12/2019 11:05 AM 0.3 mL 07/17/2019 Intramuscular   Manufacturer: [place]   Lot: [id number]   [place]: [id number]

## 2019-09-15 ENCOUNTER — Ambulatory Visit: Payer: Medicare HMO

## 2019-10-07 ENCOUNTER — Ambulatory Visit: Payer: Medicare Other | Attending: Internal Medicine

## 2019-10-07 DIAGNOSIS — Z23 Encounter for immunization: Secondary | ICD-10-CM | POA: Insufficient documentation

## 2019-10-07 NOTE — Progress Notes (Signed)
   Covid-19 Vaccination Clinic  Name:  Mary Marquez    MRN: [id number] DOB: October 08, 1950  10/07/2019  Ms. Mary Marquez was observed post Covid-19 immunization for 15 minutes without incident. She was provided with Vaccine Information Sheet and instruction to access the V-Safe system.   Ms. Mary Marquez was instructed to call 911 with any severe reactions post vaccine: Marland Kitchen Difficulty breathing  . Swelling of face and throat  . A fast heartbeat  . A bad rash all over body  . Dizziness and weakness   Immunizations Administered    Name Date Dose VIS Date Route   Pfizer COVID-19 Vaccine 10/07/2019  8:30 AM 0.3 mL 07/17/2019 Intramuscular   Manufacturer: [place]   Lot: [id number]   [place]: [id number]

## 2020-11-08 DIAGNOSIS — M8588 Other specified disorders of bone density and structure, other site: Secondary | ICD-10-CM | POA: Diagnosis not present

## 2020-11-08 DIAGNOSIS — E7800 Pure hypercholesterolemia, unspecified: Secondary | ICD-10-CM | POA: Diagnosis not present

## 2020-11-15 DIAGNOSIS — M8588 Other specified disorders of bone density and structure, other site: Secondary | ICD-10-CM | POA: Diagnosis not present

## 2020-11-15 DIAGNOSIS — Z0000 Encounter for general adult medical examination without abnormal findings: Secondary | ICD-10-CM | POA: Diagnosis not present

## 2020-11-15 DIAGNOSIS — E7800 Pure hypercholesterolemia, unspecified: Secondary | ICD-10-CM | POA: Diagnosis not present

## 2020-11-15 DIAGNOSIS — M1811 Unilateral primary osteoarthritis of first carpometacarpal joint, right hand: Secondary | ICD-10-CM | POA: Diagnosis not present

## 2020-11-15 DIAGNOSIS — M654 Radial styloid tenosynovitis [de Quervain]: Secondary | ICD-10-CM | POA: Diagnosis not present

## 2020-11-21 DIAGNOSIS — H5203 Hypermetropia, bilateral: Secondary | ICD-10-CM | POA: Diagnosis not present

## 2020-11-21 DIAGNOSIS — H52203 Unspecified astigmatism, bilateral: Secondary | ICD-10-CM | POA: Diagnosis not present

## 2020-11-21 DIAGNOSIS — H524 Presbyopia: Secondary | ICD-10-CM | POA: Diagnosis not present

## 2020-11-21 DIAGNOSIS — H35372 Puckering of macula, left eye: Secondary | ICD-10-CM | POA: Diagnosis not present

## 2020-11-21 DIAGNOSIS — H2513 Age-related nuclear cataract, bilateral: Secondary | ICD-10-CM | POA: Diagnosis not present

## 2020-11-28 ENCOUNTER — Other Ambulatory Visit: Payer: Self-pay | Admitting: Family Medicine

## 2020-11-28 DIAGNOSIS — Z1231 Encounter for screening mammogram for malignant neoplasm of breast: Secondary | ICD-10-CM

## 2020-12-06 DIAGNOSIS — Z23 Encounter for immunization: Secondary | ICD-10-CM | POA: Diagnosis not present

## 2021-01-18 ENCOUNTER — Ambulatory Visit
Admission: RE | Admit: 2021-01-18 | Discharge: 2021-01-18 | Disposition: A | Discharge status: Home / Self Care | Payer: Medicare Other | Source: Ambulatory Visit | Attending: Family Medicine | Admitting: Family Medicine

## 2021-01-18 ENCOUNTER — Other Ambulatory Visit: Payer: Self-pay

## 2021-01-18 DIAGNOSIS — Z1231 Encounter for screening mammogram for malignant neoplasm of breast: Secondary | ICD-10-CM

## 2021-03-01 DIAGNOSIS — M13841 Other specified arthritis, right hand: Secondary | ICD-10-CM | POA: Diagnosis not present

## 2021-03-01 DIAGNOSIS — M25541 Pain in joints of right hand: Secondary | ICD-10-CM | POA: Diagnosis not present

## 2021-03-01 DIAGNOSIS — M25542 Pain in joints of left hand: Secondary | ICD-10-CM | POA: Diagnosis not present

## 2021-03-01 DIAGNOSIS — M13849 Other specified arthritis, unspecified hand: Secondary | ICD-10-CM | POA: Diagnosis not present

## 2021-03-01 DIAGNOSIS — M13839 Other specified arthritis, unspecified wrist: Secondary | ICD-10-CM | POA: Diagnosis not present

## 2021-06-14 DIAGNOSIS — L821 Other seborrheic keratosis: Secondary | ICD-10-CM | POA: Diagnosis not present

## 2021-06-14 DIAGNOSIS — L298 Other pruritus: Secondary | ICD-10-CM | POA: Diagnosis not present

## 2021-06-14 DIAGNOSIS — L218 Other seborrheic dermatitis: Secondary | ICD-10-CM | POA: Diagnosis not present

## 2021-06-14 DIAGNOSIS — L570 Actinic keratosis: Secondary | ICD-10-CM | POA: Diagnosis not present

## 2021-06-14 DIAGNOSIS — I8391 Asymptomatic varicose veins of right lower extremity: Secondary | ICD-10-CM | POA: Diagnosis not present

## 2021-06-14 DIAGNOSIS — L814 Other melanin hyperpigmentation: Secondary | ICD-10-CM | POA: Diagnosis not present

## 2021-06-14 DIAGNOSIS — L820 Inflamed seborrheic keratosis: Secondary | ICD-10-CM | POA: Diagnosis not present

## 2021-06-14 DIAGNOSIS — Z85828 Personal history of other malignant neoplasm of skin: Secondary | ICD-10-CM | POA: Diagnosis not present

## 2021-06-14 DIAGNOSIS — D2271 Melanocytic nevi of right lower limb, including hip: Secondary | ICD-10-CM | POA: Diagnosis not present

## 2021-06-14 DIAGNOSIS — D225 Melanocytic nevi of trunk: Secondary | ICD-10-CM | POA: Diagnosis not present

## 2021-08-09 DIAGNOSIS — D122 Benign neoplasm of ascending colon: Secondary | ICD-10-CM | POA: Diagnosis not present

## 2021-08-09 DIAGNOSIS — K5520 Angiodysplasia of colon without hemorrhage: Secondary | ICD-10-CM | POA: Diagnosis not present

## 2021-08-09 DIAGNOSIS — K649 Unspecified hemorrhoids: Secondary | ICD-10-CM | POA: Diagnosis not present

## 2021-08-09 DIAGNOSIS — K5730 Diverticulosis of large intestine without perforation or abscess without bleeding: Secondary | ICD-10-CM | POA: Diagnosis not present

## 2021-08-09 DIAGNOSIS — Z86010 Personal history of colonic polyps: Secondary | ICD-10-CM | POA: Diagnosis not present

## 2021-08-14 DIAGNOSIS — D122 Benign neoplasm of ascending colon: Secondary | ICD-10-CM | POA: Diagnosis not present

## 2021-12-29 DIAGNOSIS — E7800 Pure hypercholesterolemia, unspecified: Secondary | ICD-10-CM | POA: Diagnosis not present

## 2021-12-29 DIAGNOSIS — M8588 Other specified disorders of bone density and structure, other site: Secondary | ICD-10-CM | POA: Diagnosis not present

## 2022-01-05 DIAGNOSIS — E7800 Pure hypercholesterolemia, unspecified: Secondary | ICD-10-CM | POA: Diagnosis not present

## 2022-01-05 DIAGNOSIS — Z1231 Encounter for screening mammogram for malignant neoplasm of breast: Secondary | ICD-10-CM | POA: Diagnosis not present

## 2022-01-05 DIAGNOSIS — M19049 Primary osteoarthritis, unspecified hand: Secondary | ICD-10-CM | POA: Diagnosis not present

## 2022-01-05 DIAGNOSIS — M8580 Other specified disorders of bone density and structure, unspecified site: Secondary | ICD-10-CM | POA: Diagnosis not present

## 2022-01-05 DIAGNOSIS — Z0000 Encounter for general adult medical examination without abnormal findings: Secondary | ICD-10-CM | POA: Diagnosis not present

## 2022-01-08 ENCOUNTER — Other Ambulatory Visit: Payer: Self-pay | Admitting: Family Medicine

## 2022-01-08 DIAGNOSIS — Z1231 Encounter for screening mammogram for malignant neoplasm of breast: Secondary | ICD-10-CM

## 2022-01-08 DIAGNOSIS — Z803 Family history of malignant neoplasm of breast: Secondary | ICD-10-CM

## 2022-01-08 DIAGNOSIS — M8580 Other specified disorders of bone density and structure, unspecified site: Secondary | ICD-10-CM

## 2022-01-19 ENCOUNTER — Ambulatory Visit
Admission: RE | Admit: 2022-01-19 | Discharge: 2022-01-19 | Disposition: A | Discharge status: Home / Self Care | Payer: Medicare Other | Source: Ambulatory Visit | Attending: Family Medicine | Admitting: Family Medicine

## 2022-01-19 DIAGNOSIS — Z803 Family history of malignant neoplasm of breast: Secondary | ICD-10-CM

## 2022-01-19 DIAGNOSIS — Z1231 Encounter for screening mammogram for malignant neoplasm of breast: Secondary | ICD-10-CM | POA: Diagnosis not present

## 2022-03-07 DIAGNOSIS — E8889 Other specified metabolic disorders: Secondary | ICD-10-CM | POA: Diagnosis not present

## 2022-03-07 DIAGNOSIS — E7800 Pure hypercholesterolemia, unspecified: Secondary | ICD-10-CM | POA: Diagnosis not present

## 2022-03-08 DIAGNOSIS — H35372 Puckering of macula, left eye: Secondary | ICD-10-CM | POA: Diagnosis not present

## 2022-03-08 DIAGNOSIS — H2513 Age-related nuclear cataract, bilateral: Secondary | ICD-10-CM | POA: Diagnosis not present

## 2022-03-08 DIAGNOSIS — H52203 Unspecified astigmatism, bilateral: Secondary | ICD-10-CM | POA: Diagnosis not present

## 2022-03-08 DIAGNOSIS — H524 Presbyopia: Secondary | ICD-10-CM | POA: Diagnosis not present

## 2022-03-08 DIAGNOSIS — H5203 Hypermetropia, bilateral: Secondary | ICD-10-CM | POA: Diagnosis not present

## 2022-03-22 DIAGNOSIS — E7800 Pure hypercholesterolemia, unspecified: Secondary | ICD-10-CM | POA: Diagnosis not present

## 2022-03-22 DIAGNOSIS — M159 Polyosteoarthritis, unspecified: Secondary | ICD-10-CM | POA: Diagnosis not present

## 2022-04-16 DIAGNOSIS — E7800 Pure hypercholesterolemia, unspecified: Secondary | ICD-10-CM | POA: Diagnosis not present

## 2022-04-16 DIAGNOSIS — M159 Polyosteoarthritis, unspecified: Secondary | ICD-10-CM | POA: Diagnosis not present

## 2022-04-18 DIAGNOSIS — L219 Seborrheic dermatitis, unspecified: Secondary | ICD-10-CM | POA: Diagnosis not present

## 2022-04-18 DIAGNOSIS — L309 Dermatitis, unspecified: Secondary | ICD-10-CM | POA: Diagnosis not present

## 2022-04-18 DIAGNOSIS — L570 Actinic keratosis: Secondary | ICD-10-CM | POA: Diagnosis not present

## 2022-05-08 DIAGNOSIS — M159 Polyosteoarthritis, unspecified: Secondary | ICD-10-CM | POA: Diagnosis not present

## 2022-05-08 DIAGNOSIS — E7800 Pure hypercholesterolemia, unspecified: Secondary | ICD-10-CM | POA: Diagnosis not present

## 2022-06-19 ENCOUNTER — Other Ambulatory Visit (HOSPITAL_BASED_OUTPATIENT_CLINIC_OR_DEPARTMENT_OTHER): Payer: Medicare Other

## 2022-06-20 ENCOUNTER — Other Ambulatory Visit: Payer: Medicare Other

## 2022-07-03 ENCOUNTER — Ambulatory Visit (HOSPITAL_BASED_OUTPATIENT_CLINIC_OR_DEPARTMENT_OTHER)
Admission: RE | Admit: 2022-07-03 | Discharge: 2022-07-03 | Disposition: A | Discharge status: Home / Self Care | Payer: Medicare Other | Source: Ambulatory Visit | Attending: Family Medicine | Admitting: Family Medicine

## 2022-07-03 DIAGNOSIS — Z13820 Encounter for screening for osteoporosis: Secondary | ICD-10-CM | POA: Insufficient documentation

## 2022-07-03 DIAGNOSIS — M8580 Other specified disorders of bone density and structure, unspecified site: Secondary | ICD-10-CM | POA: Insufficient documentation

## 2022-07-03 DIAGNOSIS — Z780 Asymptomatic menopausal state: Secondary | ICD-10-CM | POA: Insufficient documentation

## 2022-07-12 DIAGNOSIS — E7800 Pure hypercholesterolemia, unspecified: Secondary | ICD-10-CM | POA: Diagnosis not present

## 2022-07-12 DIAGNOSIS — M8580 Other specified disorders of bone density and structure, unspecified site: Secondary | ICD-10-CM | POA: Diagnosis not present

## 2022-10-03 DIAGNOSIS — L578 Other skin changes due to chronic exposure to nonionizing radiation: Secondary | ICD-10-CM | POA: Diagnosis not present

## 2022-10-03 DIAGNOSIS — L821 Other seborrheic keratosis: Secondary | ICD-10-CM | POA: Diagnosis not present

## 2022-10-03 DIAGNOSIS — Z86018 Personal history of other benign neoplasm: Secondary | ICD-10-CM | POA: Diagnosis not present

## 2022-10-03 DIAGNOSIS — L723 Sebaceous cyst: Secondary | ICD-10-CM | POA: Diagnosis not present

## 2022-10-03 DIAGNOSIS — L570 Actinic keratosis: Secondary | ICD-10-CM | POA: Diagnosis not present

## 2022-10-23 DIAGNOSIS — M25562 Pain in left knee: Secondary | ICD-10-CM | POA: Diagnosis not present

## 2023-03-08 DIAGNOSIS — J069 Acute upper respiratory infection, unspecified: Secondary | ICD-10-CM | POA: Diagnosis not present

## 2023-03-08 DIAGNOSIS — Z03818 Encounter for observation for suspected exposure to other biological agents ruled out: Secondary | ICD-10-CM | POA: Diagnosis not present

## 2023-03-10 DIAGNOSIS — Z20822 Contact with and (suspected) exposure to covid-19: Secondary | ICD-10-CM | POA: Diagnosis not present

## 2023-03-10 DIAGNOSIS — J029 Acute pharyngitis, unspecified: Secondary | ICD-10-CM | POA: Diagnosis not present

## 2023-03-10 DIAGNOSIS — H109 Unspecified conjunctivitis: Secondary | ICD-10-CM | POA: Diagnosis not present

## 2023-03-10 DIAGNOSIS — R002 Palpitations: Secondary | ICD-10-CM | POA: Diagnosis not present

## 2023-03-10 DIAGNOSIS — R059 Cough, unspecified: Secondary | ICD-10-CM | POA: Diagnosis not present

## 2023-03-10 DIAGNOSIS — J019 Acute sinusitis, unspecified: Secondary | ICD-10-CM | POA: Diagnosis not present

## 2023-03-10 DIAGNOSIS — H6692 Otitis media, unspecified, left ear: Secondary | ICD-10-CM | POA: Diagnosis not present

## 2023-03-20 DIAGNOSIS — M8580 Other specified disorders of bone density and structure, unspecified site: Secondary | ICD-10-CM | POA: Diagnosis not present

## 2023-03-20 DIAGNOSIS — B349 Viral infection, unspecified: Secondary | ICD-10-CM | POA: Diagnosis not present

## 2023-03-20 DIAGNOSIS — E7800 Pure hypercholesterolemia, unspecified: Secondary | ICD-10-CM | POA: Diagnosis not present

## 2023-03-20 DIAGNOSIS — R051 Acute cough: Secondary | ICD-10-CM | POA: Diagnosis not present

## 2023-03-22 DIAGNOSIS — U071 COVID-19: Secondary | ICD-10-CM | POA: Diagnosis not present

## 2023-04-01 ENCOUNTER — Ambulatory Visit (HOSPITAL_BASED_OUTPATIENT_CLINIC_OR_DEPARTMENT_OTHER)
Admission: RE | Admit: 2023-04-01 | Discharge: 2023-04-01 | Disposition: A | Discharge status: Home / Self Care | Payer: Medicare Other | Source: Ambulatory Visit | Attending: Family Medicine | Admitting: Family Medicine

## 2023-04-01 ENCOUNTER — Other Ambulatory Visit (HOSPITAL_BASED_OUTPATIENT_CLINIC_OR_DEPARTMENT_OTHER): Payer: Self-pay | Admitting: Family Medicine

## 2023-04-01 DIAGNOSIS — R002 Palpitations: Secondary | ICD-10-CM | POA: Diagnosis not present

## 2023-04-01 DIAGNOSIS — Z8616 Personal history of COVID-19: Secondary | ICD-10-CM | POA: Diagnosis not present

## 2023-04-01 DIAGNOSIS — U071 COVID-19: Secondary | ICD-10-CM | POA: Insufficient documentation

## 2023-04-01 DIAGNOSIS — R053 Chronic cough: Secondary | ICD-10-CM | POA: Diagnosis not present

## 2023-04-10 DIAGNOSIS — K219 Gastro-esophageal reflux disease without esophagitis: Secondary | ICD-10-CM | POA: Diagnosis not present

## 2023-04-10 DIAGNOSIS — R002 Palpitations: Secondary | ICD-10-CM | POA: Diagnosis not present

## 2023-04-22 ENCOUNTER — Other Ambulatory Visit (INDEPENDENT_AMBULATORY_CARE_PROVIDER_SITE_OTHER): Payer: Medicare Other

## 2023-04-22 ENCOUNTER — Other Ambulatory Visit: Payer: Self-pay | Admitting: Family Medicine

## 2023-04-22 DIAGNOSIS — R002 Palpitations: Secondary | ICD-10-CM

## 2023-04-22 NOTE — Progress Notes (Unsigned)
Enrolled for [employer] to mail a ZIO AT Live Telemetry monitor to patients address on file.   DOD to read.

## 2023-04-25 DIAGNOSIS — R002 Palpitations: Secondary | ICD-10-CM

## 2023-04-26 DIAGNOSIS — H52203 Unspecified astigmatism, bilateral: Secondary | ICD-10-CM | POA: Diagnosis not present

## 2023-04-26 DIAGNOSIS — H2513 Age-related nuclear cataract, bilateral: Secondary | ICD-10-CM | POA: Diagnosis not present

## 2023-04-26 DIAGNOSIS — H35372 Puckering of macula, left eye: Secondary | ICD-10-CM | POA: Diagnosis not present

## 2023-04-26 DIAGNOSIS — H524 Presbyopia: Secondary | ICD-10-CM | POA: Diagnosis not present

## 2023-04-26 DIAGNOSIS — R002 Palpitations: Secondary | ICD-10-CM | POA: Diagnosis not present

## 2023-05-01 DIAGNOSIS — R21 Rash and other nonspecific skin eruption: Secondary | ICD-10-CM | POA: Diagnosis not present

## 2023-05-02 ENCOUNTER — Other Ambulatory Visit (HOSPITAL_BASED_OUTPATIENT_CLINIC_OR_DEPARTMENT_OTHER): Payer: Self-pay | Admitting: Pain Medicine

## 2023-05-02 DIAGNOSIS — E7800 Pure hypercholesterolemia, unspecified: Secondary | ICD-10-CM

## 2023-05-13 DIAGNOSIS — L570 Actinic keratosis: Secondary | ICD-10-CM | POA: Diagnosis not present

## 2023-05-22 ENCOUNTER — Ambulatory Visit (HOSPITAL_BASED_OUTPATIENT_CLINIC_OR_DEPARTMENT_OTHER)
Admission: RE | Admit: 2023-05-22 | Discharge: 2023-05-22 | Disposition: A | Discharge status: Home / Self Care | Payer: Medicare Other | Source: Ambulatory Visit | Attending: Pain Medicine | Admitting: Pain Medicine

## 2023-05-22 DIAGNOSIS — E7800 Pure hypercholesterolemia, unspecified: Secondary | ICD-10-CM | POA: Insufficient documentation

## 2023-05-31 DIAGNOSIS — R0789 Other chest pain: Secondary | ICD-10-CM | POA: Diagnosis not present

## 2023-05-31 DIAGNOSIS — K219 Gastro-esophageal reflux disease without esophagitis: Secondary | ICD-10-CM | POA: Diagnosis not present

## 2023-06-05 DIAGNOSIS — Z0000 Encounter for general adult medical examination without abnormal findings: Secondary | ICD-10-CM | POA: Diagnosis not present

## 2023-06-05 DIAGNOSIS — Z23 Encounter for immunization: Secondary | ICD-10-CM | POA: Diagnosis not present

## 2023-10-01 DIAGNOSIS — G8929 Other chronic pain: Secondary | ICD-10-CM | POA: Diagnosis not present

## 2023-10-01 DIAGNOSIS — M25562 Pain in left knee: Secondary | ICD-10-CM | POA: Diagnosis not present

## 2023-10-01 DIAGNOSIS — M545 Low back pain, unspecified: Secondary | ICD-10-CM | POA: Diagnosis not present

## 2023-10-19 DIAGNOSIS — L239 Allergic contact dermatitis, unspecified cause: Secondary | ICD-10-CM | POA: Diagnosis not present

## 2023-10-23 DIAGNOSIS — R202 Paresthesia of skin: Secondary | ICD-10-CM | POA: Diagnosis not present

## 2023-10-23 DIAGNOSIS — L814 Other melanin hyperpigmentation: Secondary | ICD-10-CM | POA: Diagnosis not present

## 2023-10-23 DIAGNOSIS — L578 Other skin changes due to chronic exposure to nonionizing radiation: Secondary | ICD-10-CM | POA: Diagnosis not present

## 2023-10-23 DIAGNOSIS — Z85828 Personal history of other malignant neoplasm of skin: Secondary | ICD-10-CM | POA: Diagnosis not present

## 2023-10-23 DIAGNOSIS — L219 Seborrheic dermatitis, unspecified: Secondary | ICD-10-CM | POA: Diagnosis not present

## 2023-10-23 DIAGNOSIS — Z86018 Personal history of other benign neoplasm: Secondary | ICD-10-CM | POA: Diagnosis not present

## 2023-10-23 DIAGNOSIS — L821 Other seborrheic keratosis: Secondary | ICD-10-CM | POA: Diagnosis not present

## 2023-10-23 DIAGNOSIS — L570 Actinic keratosis: Secondary | ICD-10-CM | POA: Diagnosis not present

## 2023-11-16 IMAGING — MG MM DIGITAL SCREENING BILAT W/ TOMO AND CAD
8 series · 8 of 24 positions shown · non-contrast
Comparison: Previous exam(s).

CLINICAL DATA: Screening.

EXAM:
DIGITAL SCREENING BILATERAL MAMMOGRAM WITH TOMOSYNTHESIS AND CAD
TECHNIQUE: Bilateral screening digital craniocaudal and mediolateral oblique
mammograms were obtained. Bilateral screening digital breast
tomosynthesis was performed. The images were evaluated with
computer-aided detection.

[L MLO synth-2D]
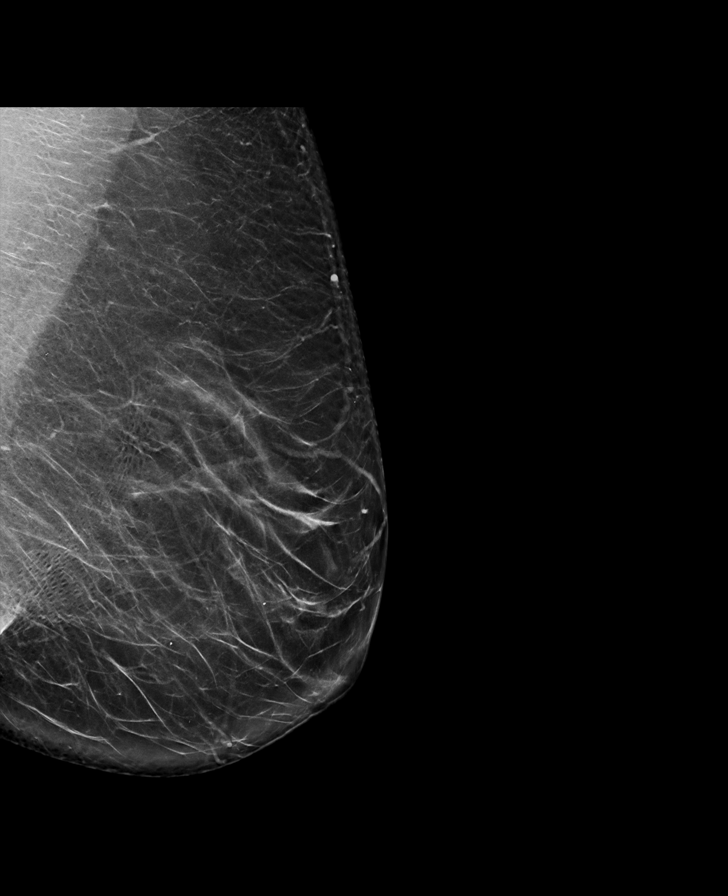

[L CC synth-2D]
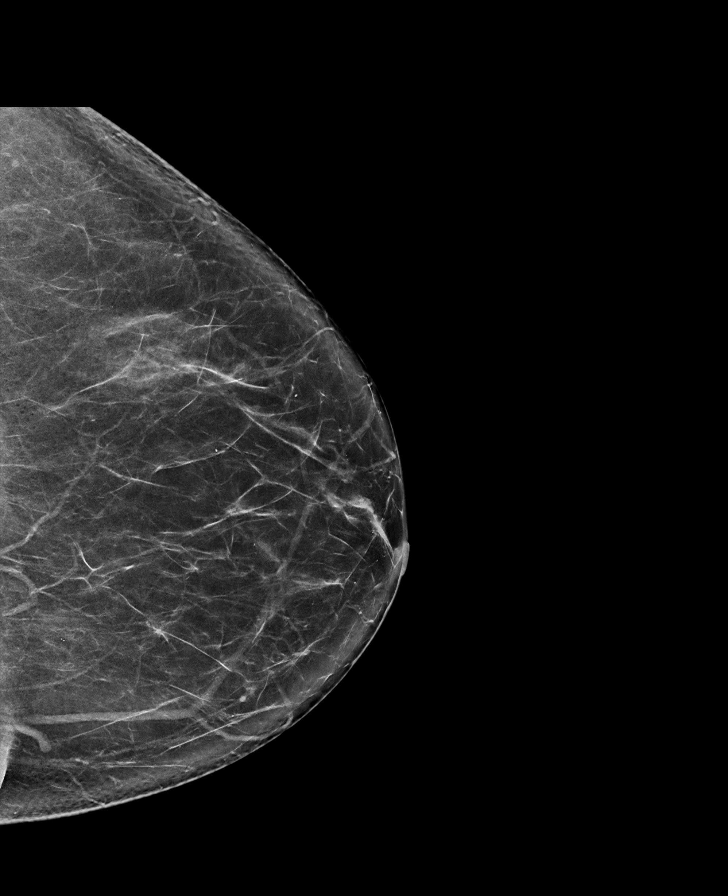

[R CC synth-2D]
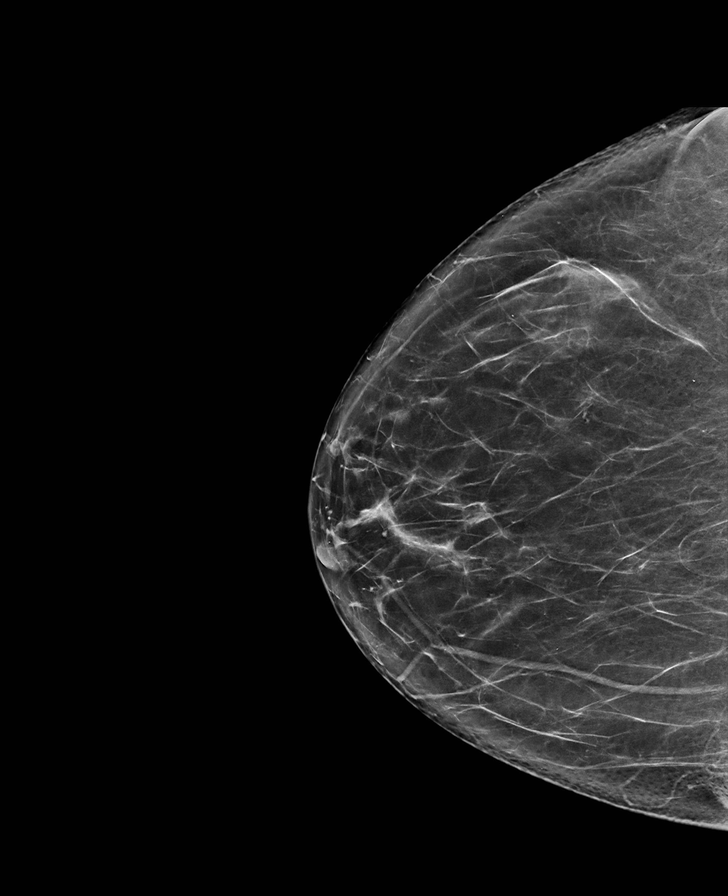

[R MLO synth-2D]
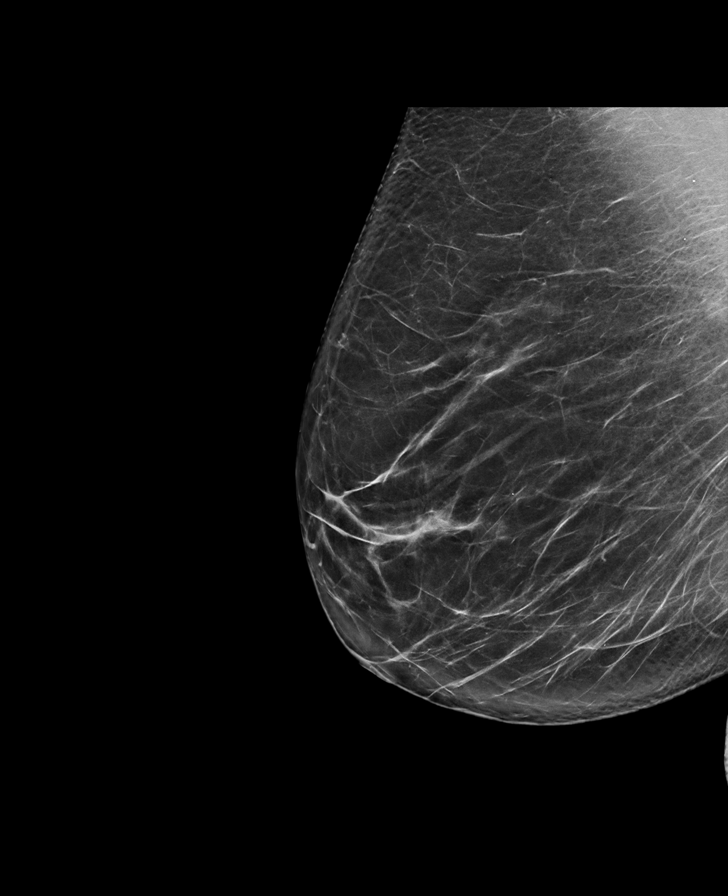

[R CC tomo · tomo slice 39/78.0]
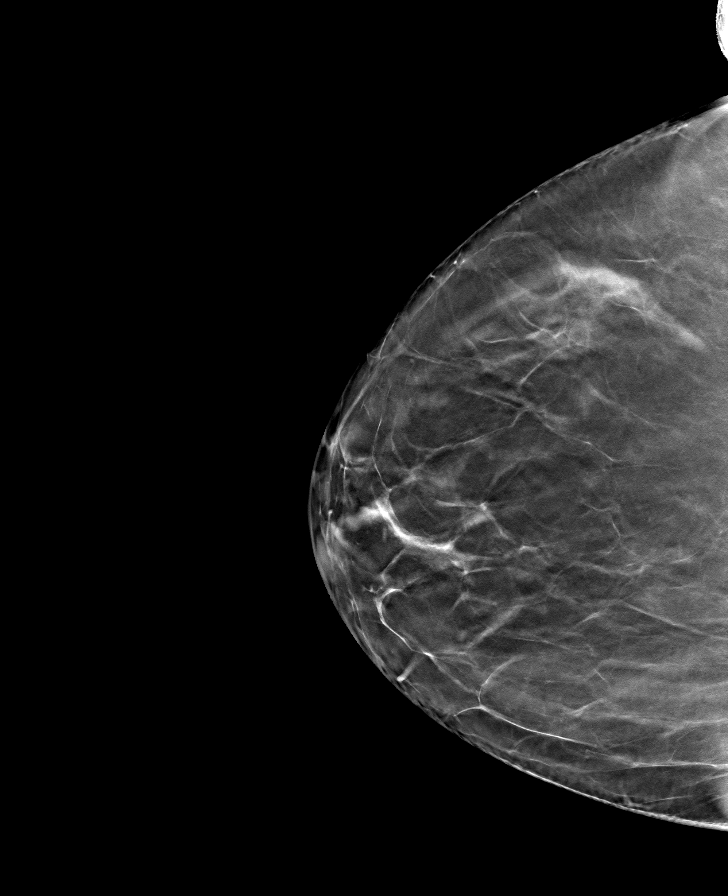

[L CC tomo · tomo slice 41/80.0]
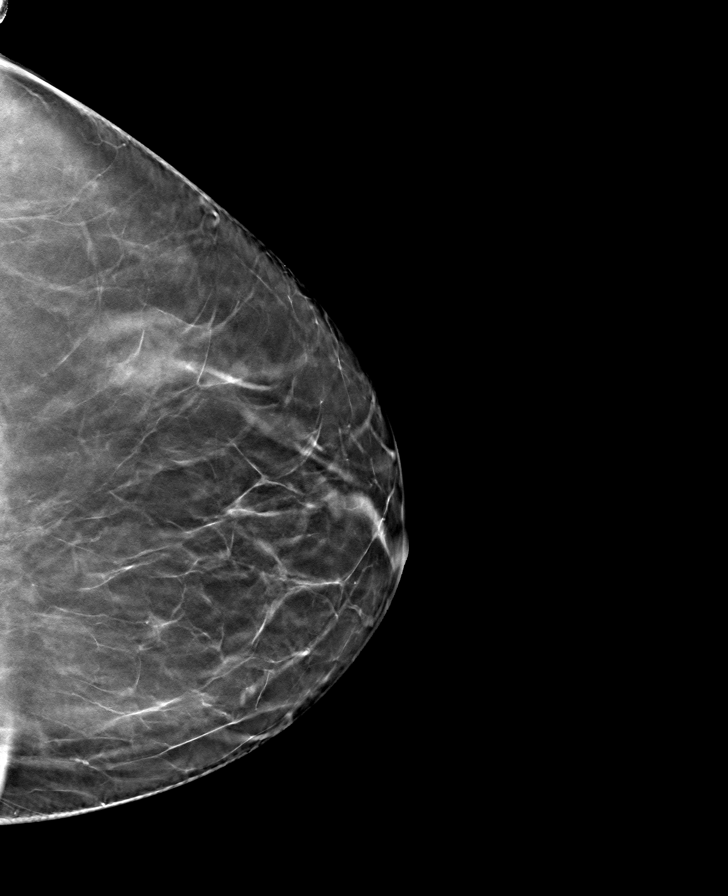

[L MLO tomo · tomo slice 46/91.0]
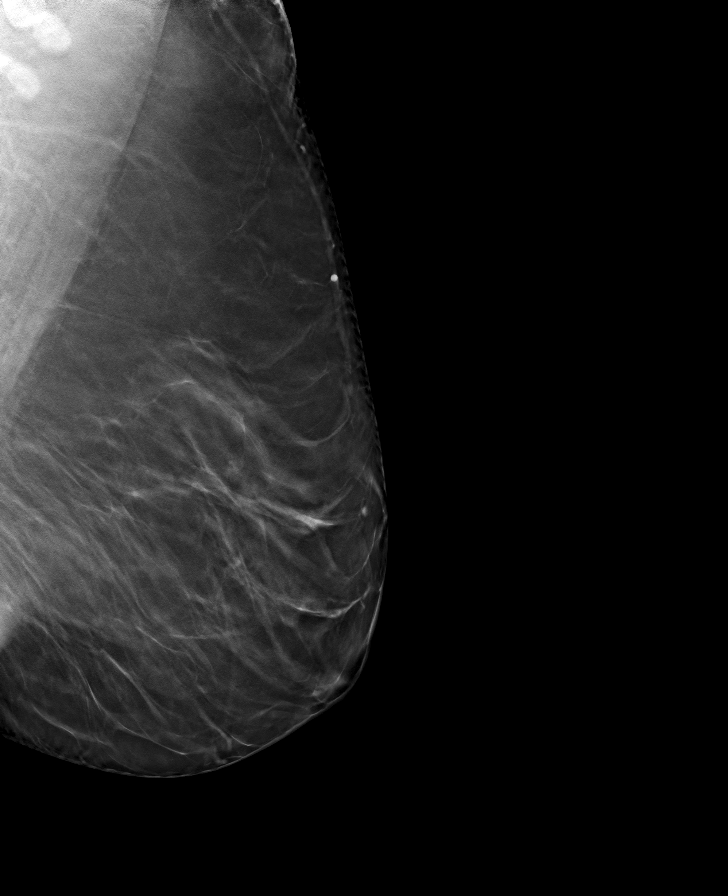

[R MLO tomo · tomo slice 43/86.0]
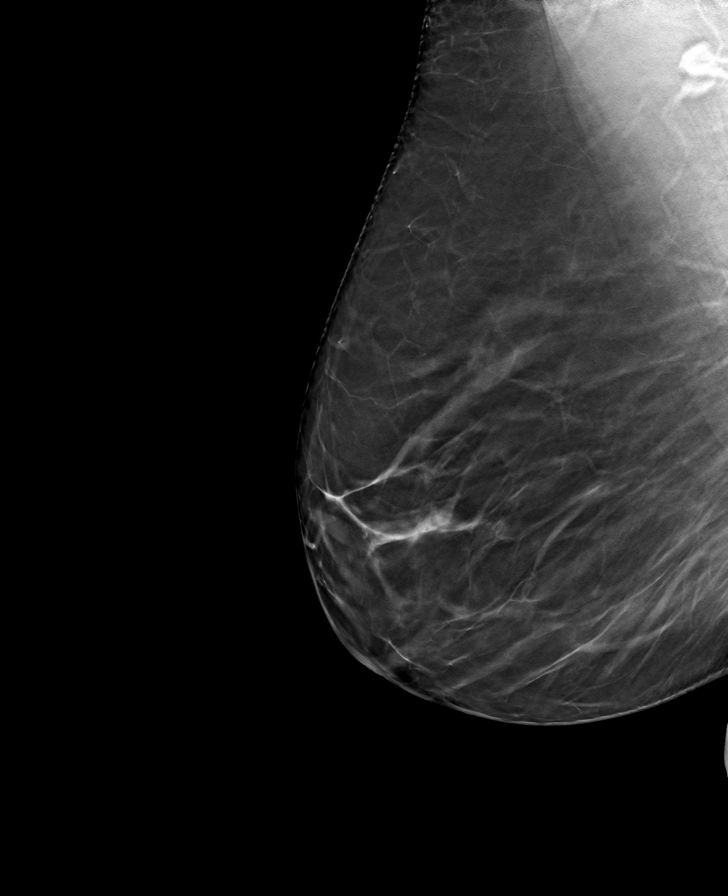

[8 of 24 positions shown; findings below may reference images not displayed]

ACR Breast Density Category b: There are scattered areas of
fibroglandular density.
FINDINGS: There are no findings suspicious for malignancy.
IMPRESSION: No mammographic evidence of malignancy. A result letter of this
screening mammogram will be mailed directly to the patient.

RECOMMENDATION:
Screening mammogram in one year. (Code:51-O-LD2)

BI-RADS CATEGORY  1: Negative.

## 2023-12-04 DIAGNOSIS — M25562 Pain in left knee: Secondary | ICD-10-CM | POA: Diagnosis not present

## 2023-12-04 DIAGNOSIS — M545 Low back pain, unspecified: Secondary | ICD-10-CM | POA: Diagnosis not present

## 2023-12-12 DIAGNOSIS — M25562 Pain in left knee: Secondary | ICD-10-CM | POA: Diagnosis not present

## 2023-12-12 DIAGNOSIS — M545 Low back pain, unspecified: Secondary | ICD-10-CM | POA: Diagnosis not present

## 2023-12-20 DIAGNOSIS — M545 Low back pain, unspecified: Secondary | ICD-10-CM | POA: Diagnosis not present

## 2023-12-20 DIAGNOSIS — M25562 Pain in left knee: Secondary | ICD-10-CM | POA: Diagnosis not present

## 2023-12-24 DIAGNOSIS — M25562 Pain in left knee: Secondary | ICD-10-CM | POA: Diagnosis not present

## 2023-12-24 DIAGNOSIS — M545 Low back pain, unspecified: Secondary | ICD-10-CM | POA: Diagnosis not present

## 2024-01-02 DIAGNOSIS — M545 Low back pain, unspecified: Secondary | ICD-10-CM | POA: Diagnosis not present

## 2024-01-02 DIAGNOSIS — M25562 Pain in left knee: Secondary | ICD-10-CM | POA: Diagnosis not present

## 2024-01-08 DIAGNOSIS — M205X1 Other deformities of toe(s) (acquired), right foot: Secondary | ICD-10-CM | POA: Diagnosis not present

## 2024-01-08 DIAGNOSIS — M205X2 Other deformities of toe(s) (acquired), left foot: Secondary | ICD-10-CM | POA: Diagnosis not present

## 2024-01-08 DIAGNOSIS — I70203 Unspecified atherosclerosis of native arteries of extremities, bilateral legs: Secondary | ICD-10-CM | POA: Diagnosis not present

## 2024-01-08 DIAGNOSIS — L602 Onychogryphosis: Secondary | ICD-10-CM | POA: Diagnosis not present

## 2024-01-08 DIAGNOSIS — L03032 Cellulitis of left toe: Secondary | ICD-10-CM | POA: Diagnosis not present

## 2024-01-22 DIAGNOSIS — L03032 Cellulitis of left toe: Secondary | ICD-10-CM | POA: Diagnosis not present

## 2024-01-22 DIAGNOSIS — L602 Onychogryphosis: Secondary | ICD-10-CM | POA: Diagnosis not present

## 2024-01-22 DIAGNOSIS — M205X1 Other deformities of toe(s) (acquired), right foot: Secondary | ICD-10-CM | POA: Diagnosis not present

## 2024-01-23 DIAGNOSIS — M545 Low back pain, unspecified: Secondary | ICD-10-CM | POA: Diagnosis not present

## 2024-01-23 DIAGNOSIS — M25562 Pain in left knee: Secondary | ICD-10-CM | POA: Diagnosis not present

## 2024-02-11 DIAGNOSIS — M545 Low back pain, unspecified: Secondary | ICD-10-CM | POA: Diagnosis not present

## 2024-02-11 DIAGNOSIS — M25562 Pain in left knee: Secondary | ICD-10-CM | POA: Diagnosis not present

## 2024-03-03 DIAGNOSIS — H903 Sensorineural hearing loss, bilateral: Secondary | ICD-10-CM | POA: Diagnosis not present

## 2024-03-03 DIAGNOSIS — Z822 Family history of deafness and hearing loss: Secondary | ICD-10-CM | POA: Diagnosis not present

## 2024-03-03 DIAGNOSIS — H9313 Tinnitus, bilateral: Secondary | ICD-10-CM | POA: Diagnosis not present

## 2024-03-24 DIAGNOSIS — L03032 Cellulitis of left toe: Secondary | ICD-10-CM | POA: Diagnosis not present

## 2024-03-24 DIAGNOSIS — M205X1 Other deformities of toe(s) (acquired), right foot: Secondary | ICD-10-CM | POA: Diagnosis not present

## 2024-03-24 DIAGNOSIS — L602 Onychogryphosis: Secondary | ICD-10-CM | POA: Diagnosis not present

## 2024-04-08 ENCOUNTER — Ambulatory Visit (HOSPITAL_BASED_OUTPATIENT_CLINIC_OR_DEPARTMENT_OTHER): Attending: Family Medicine | Admitting: Physical Therapy

## 2024-04-08 ENCOUNTER — Encounter (HOSPITAL_BASED_OUTPATIENT_CLINIC_OR_DEPARTMENT_OTHER): Payer: Self-pay | Admitting: Physical Therapy

## 2024-04-08 ENCOUNTER — Other Ambulatory Visit: Payer: Self-pay

## 2024-04-08 DIAGNOSIS — M6281 Muscle weakness (generalized): Secondary | ICD-10-CM | POA: Diagnosis not present

## 2024-04-08 DIAGNOSIS — M25551 Pain in right hip: Secondary | ICD-10-CM | POA: Diagnosis not present

## 2024-04-08 NOTE — Therapy (Incomplete)
 OUTPATIENT PHYSICAL THERAPY LOWER EXTREMITY EVALUATION   Patient Name: Mary Marquez MRN: [id number] DOB:[birth year], 73 y.o., female Today's Date: 04/09/2024  END OF SESSION:  PT End of Session - 04/08/24 1203     Visit Number 1    Number of Visits 4    Date for PT Re-Evaluation 05/20/24    Authorization Type [employer] MCR   PT Start Time 1111    PT Stop Time 1157    PT Time Calculation (min) 46 min    Activity Tolerance Patient tolerated treatment well    Behavior During Therapy WFL for tasks assessed/performed          Past Medical History:  Diagnosis Date   Arthritis    Cancer (HCC)    Basal cell   Endometriosis    Fibromyalgia 1991   Infertility, female    Urinary incontinence    Past Surgical History:  Procedure Laterality Date   ANKLE SURGERY     CHOLECYSTECTOMY     LAPAROTOMY  1986   salpingectomy   MOUTH SURGERY  2011   PELVIC LAPAROSCOPY  1985   DIAG LAP W/TUBAL LAVAGE   POLYP REMOVED Right 2011   Near side side of Nose   removal fallopian tube  1987   TONSILLECTOMY     Patient Active Problem List   Diagnosis Date Noted   Fibromyalgia    Urinary incontinence    Arthritis    Endometriosis      REFERRING PROVIDER: Aisha Harvey, MD  REFERRING DIAG: M25.551 (ICD-10-CM) - Pain in right hip Bilat hip pain   THERAPY DIAG:  Pain in right hip  Muscle weakness (generalized)  Rationale for Evaluation and Treatment: Rehabilitation  ONSET DATE: chronic pain /  PT order 02/13/2024  SUBJECTIVE:   SUBJECTIVE STATEMENT: Pt has chronic R hip pain for at least 5 years.  Pt states her hip is improving.  She was unable to tie her shoes and put socks on though is able to now.  Pt reports improved pain after receiving massages.  She states the massage therapist worked on her ITB and she felt better afterwards.    Pt is active with yard work and doesn't have hip pain during the activities.  She has pain lying down at night and can have disturbed sleep.  Pt  has pain and difficulty with standing up after sitting for 30 mins.  She has difficulty after sitting in a restaurant.     Pt swims 2-3x/wk and performs 20 laps.  She performed 20-25 laps today.  Pt denies any hip pain with swimming.     Pt received 6 weeks of PT for bilat knee pain in June/July and responded well.  Pt states her knees are doing good now, though she is very careful.    PERTINENT HISTORY: Arthritis, fibromyalgia, L ankle surgery  PAIN:  NPRS:  2/10 current, 0/10 best, 5/10 worst Location: ant and posterior R hip  Easing Factors:  Tylenol, massage therapy, whirlpool  PRECAUTIONS: Other: fibromyalgia    WEIGHT BEARING RESTRICTIONS: No  FALLS:  Has patient fallen in last 6 months? No  LIVING ENVIRONMENT: Lives with: lives with their spouse Lives in: 1 story Stairs: yes with railings   OCCUPATION: retired  PLOF: Independent  PATIENT GOALS: to transition to personal training at the end of PT, to feel better   OBJECTIVE:  Note: Objective measures were completed at Evaluation unless otherwise noted.  DIAGNOSTIC FINDINGS: N/A  PATIENT SURVEYS:  LEFS  Extreme difficulty/unable (  0), Quite a bit of difficulty (1), Moderate difficulty (2), Little difficulty (3), No difficulty (4) Survey date:  04/08/24  Any of your usual work, housework or school activities 3.5  2. Usual hobbies, recreational or sporting activities 3  3. Getting into/out of the bath 0  4. Walking between rooms 0  5. Putting on socks/shoes 2.5  6. Squatting    7. Lifting an object, like a bag of groceries from the floor 4  8. Performing light activities around your home 4  9. Performing heavy activities around your home 3  10. Getting into/out of a car 3  11. Walking 2 blocks 3  12. Walking 1 mile   13. Going up/down 10 stairs (1 flight) 2  14. Standing for 1 hour 2  15.  sitting for 1 hour   16. Running on even ground   17. Running on uneven ground   18. Making sharp turns while running  fast   19. Hopping    20. Rolling over in bed   Score total:  30/80     COGNITION: Overall cognitive status: Within functional limits for tasks assessed      PALPATION: TTP:  bilat ITB and glute, worse on R.  Minimal tenderness in R ant hip  LOWER EXTREMITY ROM:  Active ROM Right eval Left eval  Hip flexion 108   Hip extension    Hip abduction    Hip adduction    Hip internal rotation 28 32  Hip external rotation 20 24  Knee flexion    Knee extension    Ankle dorsiflexion    Ankle plantarflexion    Ankle inversion    Ankle eversion     (Blank rows = not tested)  LOWER EXTREMITY MMT:  MMT Right eval Left eval  Hip flexion 5/5 5/5  Hip extension    Hip abduction 4/5 4/5  Hip adduction    Hip internal rotation 5/5 5/5  Hip external rotation 5/5 5/5  Knee flexion 5/5 seated  5/5 seated  Knee extension 4+/5 4+/5  Ankle dorsiflexion    Ankle plantarflexion    Ankle inversion    Ankle eversion     (Blank rows = not tested)  LOWER EXTREMITY SPECIAL TESTS:  Scour's: negative bilat FABWER's test:  negative bilat  FUNCTIONAL TESTS:  5 times sit to stand: 11.8 sec without UE support                                                                                                                                TREATMENT:   Reviewed HEP.  Pt was performing S/L hip abduction, supine SLR, supine bridge but hasn't been performing HEP recently.  PT instructed pt to continue with HEP.   PATIENT EDUCATION:  Education details:  Dx, POC, rationale of interventions, relevant anatomy, prognosis, objective findings, and what to expect next Rx.  PT answered pt's questions. Person educated: Patient Education method: Explanation  and Demonstration Education comprehension: verbalized understanding, returned demonstration, and needs further education  HOME EXERCISE PROGRAM: Pt has a HEP.  Will update.  ASSESSMENT:  CLINICAL IMPRESSION: Patient is a 73 y.o. female with a dx  of R hip pain.  Pt recently responded well to PT for bilat knee pain this summer.  Pt has pain and difficulty with standing up after sitting for 30 mins.  She has pain lying down at night and can have disturbed sleep.  Pt is active with yard work and swimming.  Pt has muscle weakness in bilat hip abd and minimal deficits in knee extension.  Pt has a HEP from prior PT that she has not been performing.  Pt should benefit from skilled PT to address impairments and improve overall function.    OBJECTIVE IMPAIRMENTS: decreased mobility, decreased ROM, decreased strength, hypomobility, increased fascial restrictions, and pain.   ACTIVITY LIMITATIONS: squatting, sleeping, and transfers  PARTICIPATION LIMITATIONS: community activity  PERSONAL FACTORS: Time since onset of injury/illness/exacerbation and 1-2 comorbidities: arthritis and fibromyalgia are also affecting patient's functional outcome.   REHAB POTENTIAL: Good  CLINICAL DECISION MAKING: Stable/uncomplicated  EVALUATION COMPLEXITY: Low   GOALS:   SHORT TERM GOALS:   Pt will tolerate exercises without adverse effects for improved strength and performance of daily activities.   Baseline: Goal status: INITIAL Target date: 04/29/24  2.  Pt will report at least a 25% improvement in pain when standing up to leave restaurants. Baseline:  Goal status: INITIAL Target date: 05/06/2024    LONG TERM GOALS: Target date: 05/20/2024  Pt will report at least a 60-70% improvement in pain and sx's overall.  Baseline:  Goal status: INITIAL  2.  Pt will demo improved R hip abduction strength and R knee extension strength to 5/5 MMT for improved performance of and tolerance with functional mobility.   Baseline:  Goal status: INITIAL  3.  Pt will be independent and compliant with HEP for improved pain, strength, and function.   Baseline:  Goal status: INITIAL     PLAN:  PT FREQUENCY:  1x/wk and once every other week  PT DURATION:  6  weeks   PLANNED INTERVENTIONS: 97164- PT Re-evaluation, 97750- Physical Performance Testing, 97110-Therapeutic exercises, 97530- Therapeutic activity, [id number]- Neuromuscular re-education, 97535- Self Care, 02859- Manual therapy, [phone]- Gait training, [phone]- Aquatic Therapy, [phone]- Electrical stimulation (unattended), [phone]- Electrical stimulation (manual), [id number]- Ultrasound, Patient/Family education, Balance training, Stair training, Taping, Joint mobilization, Spinal mobilization, Cryotherapy, and Moist heat  PLAN FOR NEXT SESSION: STM to ITB and glute.  LE strengthening.  Work on [employer].    Leigh Minerva III PT, DPT 04/09/24 10:37 PM      Date of referral: 02/13/2024 Referring provider: Aisha Harvey, MD Referring diagnosis? M25.551 (ICD-10-CM) - Pain in right hip Treatment diagnosis? (if different than referring diagnosis)  Pain in right hip  Muscle weakness (generalized)  What was this (referring dx) caused by? Ongoing Issue  Lysle of Condition: Chronic (continuous duration > 3 months)   Laterality: Rt  Current Functional Measure Score: LEFS 30/80  Objective measurements identify impairments when they are compared to normal values, the uninvolved extremity, and prior level of function.  [x]  Yes  []  No  Objective assessment of functional ability: Minimal functional limitations   Briefly describe symptoms: R hip pain, muscle weakness in bilat LE's. Pt is active with yard work and doesn't have hip pain during the activities.  She has pain lying down at night and can have disturbed sleep.  Pt  has pain and difficulty with standing up after sitting for 30 mins.  She has difficulty after sitting in a restaurant.   How did symptoms start: chronic pain  Average pain intensity:  2/10 current, 0/10 best, 5/10 worst  How often does the pt experience symptoms? Occasionally  How much have the symptoms interfered with usual daily activities?   How has condition changed since care  began at this facility? NA - initial visit  In general, how is the patients overall health?

## 2024-04-13 ENCOUNTER — Other Ambulatory Visit (HOSPITAL_BASED_OUTPATIENT_CLINIC_OR_DEPARTMENT_OTHER): Payer: Self-pay | Admitting: Family Medicine

## 2024-04-13 DIAGNOSIS — Z1231 Encounter for screening mammogram for malignant neoplasm of breast: Secondary | ICD-10-CM

## 2024-05-06 ENCOUNTER — Other Ambulatory Visit (HOSPITAL_BASED_OUTPATIENT_CLINIC_OR_DEPARTMENT_OTHER): Payer: Self-pay

## 2024-05-06 MED ORDER — FLUZONE HIGH-DOSE 0.5 ML IM SUSY
0.5000 mL | PREFILLED_SYRINGE | Freq: Once | INTRAMUSCULAR | 0 refills | Status: AC
  Filled 2024-05-06: qty 0.5, 1d supply, fill #0

## 2024-05-07 ENCOUNTER — Ambulatory Visit
Admission: RE | Admit: 2024-05-07 | Discharge: 2024-05-07 | Disposition: A | Discharge status: Home / Self Care | Source: Ambulatory Visit | Attending: Family Medicine | Admitting: Family Medicine

## 2024-05-07 DIAGNOSIS — Z1231 Encounter for screening mammogram for malignant neoplasm of breast: Secondary | ICD-10-CM | POA: Diagnosis not present

## 2024-05-12 ENCOUNTER — Ambulatory Visit (HOSPITAL_BASED_OUTPATIENT_CLINIC_OR_DEPARTMENT_OTHER): Attending: Family Medicine | Admitting: Physical Therapy

## 2024-05-12 DIAGNOSIS — M6281 Muscle weakness (generalized): Secondary | ICD-10-CM | POA: Insufficient documentation

## 2024-05-12 DIAGNOSIS — M25551 Pain in right hip: Secondary | ICD-10-CM | POA: Insufficient documentation

## 2024-05-12 NOTE — Therapy (Signed)
 OUTPATIENT PHYSICAL THERAPY LOWER EXTREMITY TREATMENT   Patient Name: Mary Marquez MRN: [id number] DOB:1951/02/13, 73 y.o., female Today's Date: 05/13/2024  END OF SESSION: PT End of Session - 07/12/2024    Visit Number 2   Number of Visits 4   Date for PT Re-Evaluation 05/20/2024   Authorization Type U[employer] MCR    PT Start Time 1410    PT Stop Time 1453   PT Time Calculation (min) 43 min   Activity Tolerance Patient tolerated treatment well   Behavior During Therapy  WFL for tasks assessed/performed      Past Medical History:  Diagnosis Date   Arthritis    Cancer (HCC)    Basal cell   Endometriosis    Fibromyalgia 1991   Infertility, female    Urinary incontinence    Past Surgical History:  Procedure Laterality Date   ANKLE SURGERY     CHOLECYSTECTOMY     LAPAROTOMY  1986   salpingectomy   MOUTH SURGERY  2011   PELVIC LAPAROSCOPY  1985   DIAG LAP W/TUBAL LAVAGE   POLYP REMOVED Right 2011   Near side side of Nose   removal fallopian tube  1987   TONSILLECTOMY     Patient Active Problem List   Diagnosis Date Noted   Fibromyalgia    Urinary incontinence    Arthritis    Endometriosis      REFERRING PROVIDER: Aisha Harvey, MD  REFERRING DIAG: M25.551 (ICD-10-CM) - Pain in right hip Bilat hip pain   THERAPY DIAG:  Pain in right hip  Muscle weakness (generalized)  Rationale for Evaluation and Treatment: Rehabilitation  ONSET DATE: chronic pain /  PT order 02/13/2024  SUBJECTIVE:   SUBJECTIVE STATEMENT: Pt states her knees are better.  She can do the wrong thing like twisting which causes pain.  Pt has been swimming a lot and is increasing her swimming.  She has been swimming 24 laps 3 times per week.  Pt has pain lying down at night.  Pt has a lawn tractor and not sure if that increases her pain.  She has 3-4 acres to [employer].  Pt has stiffness and difficulty with standing up after sitting for 30 mins.  She has difficulty after sitting in a  restaurant. Pt states she had much increased ant and post hip pain after the PT evaluation.  She had difficulty sleeping and has been using Tylenol.  Pt states it's not really hurting now.   Pt wants to perform gym exercises.    Pt received 6 weeks of PT for bilat knee pain in June/July and responded well.  Pt states her knees are doing good now, though she is very careful.    PERTINENT HISTORY: Arthritis, fibromyalgia, L ankle surgery  PAIN:  NPRS:  0/10 current, 0/10 best, 5/10 worst Location: ant and posterior R hip  Easing Factors:  Tylenol, massage therapy, whirlpool  PRECAUTIONS: Other: fibromyalgia    WEIGHT BEARING RESTRICTIONS: No  FALLS:  Has patient fallen in last 6 months? No  LIVING ENVIRONMENT: Lives with: lives with their spouse Lives in: 1 story Stairs: yes with railings   OCCUPATION: retired  PLOF: Independent  PATIENT GOALS: to transition to personal training at the end of PT, to feel better   OBJECTIVE:  Note: Objective measures were completed at Evaluation unless otherwise noted.  DIAGNOSTIC FINDINGS: N/A  PATIENT SURVEYS:  LEFS  Extreme difficulty/unable (0), Quite a bit of difficulty (1), Moderate difficulty (2), Little difficulty (3), No  difficulty (4) Survey date:  04/08/24  Any of your usual work, housework or school activities 3.5  2. Usual hobbies, recreational or sporting activities 3  3. Getting into/out of the bath 0  4. Walking between rooms 0  5. Putting on socks/shoes 2.5  6. Squatting    7. Lifting an object, like a bag of groceries from the floor 4  8. Performing light activities around your home 4  9. Performing heavy activities around your home 3  10. Getting into/out of a car 3  11. Walking 2 blocks 3  12. Walking 1 mile   13. Going up/down 10 stairs (1 flight) 2  14. Standing for 1 hour 2  15.  sitting for 1 hour   16. Running on even ground   17. Running on uneven ground   18. Making sharp turns while running fast    19. Hopping    20. Rolling over in bed   Score total:  30/80     COGNITION: Overall cognitive status: Within functional limits for tasks assessed      PALPATION: TTP:  bilat ITB and glute, worse on R.  Minimal tenderness in R ant hip  LOWER EXTREMITY ROM:  Active ROM Right eval Left eval  Hip flexion 108   Hip extension    Hip abduction    Hip adduction    Hip internal rotation 28 32  Hip external rotation 20 24  Knee flexion    Knee extension    Ankle dorsiflexion    Ankle plantarflexion    Ankle inversion    Ankle eversion     (Blank rows = not tested)  LOWER EXTREMITY MMT:  MMT Right eval Left eval  Hip flexion 5/5 5/5  Hip extension    Hip abduction 4/5 4/5  Hip adduction    Hip internal rotation 5/5 5/5  Hip external rotation 5/5 5/5  Knee flexion 5/5 seated  5/5 seated  Knee extension 4+/5 4+/5  Ankle dorsiflexion    Ankle plantarflexion    Ankle inversion    Ankle eversion     (Blank rows = not tested)  LOWER EXTREMITY SPECIAL TESTS:  Scour's:  negative bilat FABER's test:  negative bilat  FUNCTIONAL TESTS:  5 times sit to stand: 11.8 sec without UE support                                                                                                                                TREATMENT:   Reviewed current function, pain level, and response to prior treatment.  Supine bridge 2x10 supine SLR x 7-8 reps, 10 reps on R, 2x10 on L S/L hip abduction 2x10 R, 1x10 L  Pt had pain on R hip with L hip abduction S/L clams 2x10 R LE Standing Hip abduction x 10 reps Marching on airex with 1 UE assist  See below for pt education.  HEP  PT answered questions concerning  sx response, HEP, and using ice.    PATIENT EDUCATION:  Education details:  Dx, POC, rationale of interventions, relevant anatomy, and what to expect next Rx.  PT answered pt's questions. Person educated: Patient Education method: Medical illustrator Education  comprehension: verbalized understanding, returned demonstration, and needs further education  HOME EXERCISE PROGRAM: Access Code: [email or web address] URL: [email or web address] Date: 05/12/2024 Prepared by: Mose Minerva  Exercises - Supine Bridge  - 1 x daily - 7 x weekly - 2 sets - 10 reps - Supine Active Straight Leg Raise  - 1 x daily - 6-7 x weekly - 2 sets - 5-7 reps - Sidelying Hip Abduction  - 1 x daily - 6-7 x weekly - 2 sets - 7-10 reps - Clamshell  - 1 x daily - 6-7 x weekly - 2 sets - 10 reps  ASSESSMENT:  CLINICAL IMPRESSION: Patient is a 73 y.o. female with a dx of R hip pain.  Pt recently responded well to PT for bilat knee pain this summer.  Pt has pain and difficulty with standing up after sitting for 30 mins.  She has pain lying down at night and can have disturbed sleep.  Pt is active with yard work and swimming.  Pt has muscle weakness in bilat hip abd and minimal deficits in knee extension.  Pt has a HEP from prior PT that she has not been performing.  Pt should benefit from skilled PT to address impairments and improve overall function.    OBJECTIVE IMPAIRMENTS: decreased mobility, decreased ROM, decreased strength, hypomobility, increased fascial restrictions, and pain.   ACTIVITY LIMITATIONS: squatting, sleeping, and transfers  PARTICIPATION LIMITATIONS: community activity  PERSONAL FACTORS: Time since onset of injury/illness/exacerbation and 1-2 comorbidities: arthritis and fibromyalgia are also affecting patient's functional outcome.   REHAB POTENTIAL: Good  CLINICAL DECISION MAKING: Stable/uncomplicated  EVALUATION COMPLEXITY: Low   GOALS:   SHORT TERM GOALS:   Pt will tolerate exercises without adverse effects for improved strength and performance of daily activities.   Baseline: Goal status: INITIAL Target date: 04/29/24  2.  Pt will report at least a 25% improvement in pain when standing up to leave restaurants. Baseline:  Goal status:  INITIAL Target date: 05/06/2024    LONG TERM GOALS: Target date: 05/20/2024  Pt will report at least a 60-70% improvement in pain and sx's overall.  Baseline:  Goal status: INITIAL  2.  Pt will demo improved R hip abduction strength and R knee extension strength to 5/5 MMT for improved performance of and tolerance with functional mobility.   Baseline:  Goal status: INITIAL  3.  Pt will be independent and compliant with HEP for improved pain, strength, and function.   Baseline:  Goal status: INITIAL     PLAN:  PT FREQUENCY:  1x/wk and once every other week  PT DURATION:  6 weeks   PLANNED INTERVENTIONS: 97164- PT Re-evaluation, 97750- Physical Performance Testing, 97110-Therapeutic exercises, 97530- Therapeutic activity, [id number]- Neuromuscular re-education, 97535- Self Care, 02859- Manual therapy, [phone]- Gait training, [phone]- Aquatic Therapy, [phone]- Electrical stimulation (unattended), [phone]- Electrical stimulation (manual), [id number]- Ultrasound, Patient/Family education, Balance training, Stair training, Taping, Joint mobilization, Spinal mobilization, Cryotherapy, and Moist heat  PLAN FOR NEXT SESSION: STM to ITB and glute.  LE strengthening.  Work on [employer].    Leigh Minerva III PT, DPT 05/13/24 10:02 PM      Date of referral: 02/13/2024 Referring provider: Aisha Harvey, MD Referring diagnosis? M25.551 (ICD-10-CM) - Pain in right hip Treatment diagnosis? (if different  than referring diagnosis)  Pain in right hip  Muscle weakness (generalized)  What was this (referring dx) caused by? Ongoing Issue  Lysle of Condition: Chronic (continuous duration > 3 months)   Laterality: Rt  Current Functional Measure Score: LEFS 30/80  Objective measurements identify impairments when they are compared to normal values, the uninvolved extremity, and prior level of function.  [x]  Yes  []  No  Objective assessment of functional ability: Minimal functional limitations   Briefly  describe symptoms: R hip pain, muscle weakness in bilat LE's. Pt is active with yard work and doesn't have hip pain during the activities.  She has pain lying down at night and can have disturbed sleep.  Pt has pain and difficulty with standing up after sitting for 30 mins.  She has difficulty after sitting in a restaurant.   How did symptoms start: chronic pain  Average pain intensity:  2/10 current, 0/10 best, 5/10 worst  How often does the pt experience symptoms? Occasionally  How much have the symptoms interfered with usual daily activities?   How has condition changed since care began at this facility? NA - initial visit  In general, how is the patients overall health?

## 2024-05-13 ENCOUNTER — Encounter (HOSPITAL_BASED_OUTPATIENT_CLINIC_OR_DEPARTMENT_OTHER): Payer: Self-pay | Admitting: Physical Therapy

## 2024-05-27 ENCOUNTER — Encounter (HOSPITAL_BASED_OUTPATIENT_CLINIC_OR_DEPARTMENT_OTHER): Admitting: Physical Therapy

## 2024-06-10 ENCOUNTER — Encounter (HOSPITAL_BASED_OUTPATIENT_CLINIC_OR_DEPARTMENT_OTHER): Admitting: Physical Therapy
